# Patient Record
Sex: Male | Born: 1935 | Race: White | Hispanic: No | Smoking: Former smoker
Health system: Southern US, Community
[De-identification: ages and names within clinical notes are randomized; demographics above are authoritative.]

## PROBLEM LIST (undated history)

## (undated) DIAGNOSIS — J449 Chronic obstructive pulmonary disease, unspecified: Secondary | ICD-10-CM

## (undated) DIAGNOSIS — E785 Hyperlipidemia, unspecified: Secondary | ICD-10-CM

## (undated) DIAGNOSIS — I4891 Unspecified atrial fibrillation: Secondary | ICD-10-CM

## (undated) DIAGNOSIS — I639 Cerebral infarction, unspecified: Secondary | ICD-10-CM

## (undated) DIAGNOSIS — I10 Essential (primary) hypertension: Secondary | ICD-10-CM

## (undated) DIAGNOSIS — S72001A Fracture of unspecified part of neck of right femur, initial encounter for closed fracture: Secondary | ICD-10-CM

## (undated) DIAGNOSIS — J189 Pneumonia, unspecified organism: Secondary | ICD-10-CM

## (undated) HISTORY — DX: Hyperlipidemia, unspecified: E78.5

## (undated) HISTORY — DX: Cerebral infarction, unspecified: I63.9

## (undated) HISTORY — DX: Essential (primary) hypertension: I10

## (undated) HISTORY — DX: Unspecified atrial fibrillation: I48.91

## (undated) HISTORY — DX: Pneumonia, unspecified organism: J18.9

## (undated) HISTORY — PX: LACERATION REPAIR: SHX5168

## (undated) HISTORY — PX: CAROTID ENDARTERECTOMY: SUR193

## (undated) HISTORY — DX: Fracture of unspecified part of neck of right femur, initial encounter for closed fracture: S72.001A

## (undated) HISTORY — DX: Chronic obstructive pulmonary disease, unspecified: J44.9

## (undated) HISTORY — PX: OTHER SURGICAL HISTORY: SHX169

---

## 2005-08-04 ENCOUNTER — Ambulatory Visit: Payer: Self-pay | Admitting: Internal Medicine

## 2005-11-02 ENCOUNTER — Ambulatory Visit: Payer: Self-pay | Admitting: Internal Medicine

## 2008-02-03 ENCOUNTER — Ambulatory Visit: Payer: Self-pay | Admitting: Gastroenterology

## 2008-02-03 LAB — CONVERTED CEMR LAB
OCCULT 1: NEGATIVE
OCCULT 3: NEGATIVE
OCCULT 4: NEGATIVE
OCCULT 5: NEGATIVE

## 2008-05-28 ENCOUNTER — Telehealth: Payer: Self-pay | Admitting: Gastroenterology

## 2009-07-19 ENCOUNTER — Ambulatory Visit: Payer: Self-pay | Admitting: Internal Medicine

## 2009-07-19 ENCOUNTER — Telehealth: Payer: Self-pay | Admitting: Internal Medicine

## 2009-07-19 DIAGNOSIS — R609 Edema, unspecified: Secondary | ICD-10-CM

## 2009-07-19 DIAGNOSIS — R0989 Other specified symptoms and signs involving the circulatory and respiratory systems: Secondary | ICD-10-CM

## 2009-07-19 DIAGNOSIS — I4891 Unspecified atrial fibrillation: Secondary | ICD-10-CM | POA: Insufficient documentation

## 2009-07-19 DIAGNOSIS — J449 Chronic obstructive pulmonary disease, unspecified: Secondary | ICD-10-CM

## 2009-07-19 DIAGNOSIS — R0609 Other forms of dyspnea: Secondary | ICD-10-CM

## 2009-07-20 LAB — CONVERTED CEMR LAB
ALT: 13 units/L (ref 0–53)
AST: 18 units/L (ref 0–37)
Alkaline Phosphatase: 90 units/L (ref 39–117)
Basophils Absolute: 0 10*3/uL (ref 0.0–0.1)
Basophils Relative: 0 % (ref 0–1)
CO2: 22 meq/L (ref 19–32)
Creatinine, Ser: 0.77 mg/dL (ref 0.40–1.50)
Eosinophils Absolute: 0.1 10*3/uL (ref 0.0–0.7)
MCHC: 35.1 g/dL (ref 30.0–36.0)
MCV: 95 fL (ref 78.0–100.0)
Neutro Abs: 4.5 10*3/uL (ref 1.7–7.7)
Neutrophils Relative %: 64 % (ref 43–77)
Platelets: 196 10*3/uL (ref 150–400)
RBC: 4.59 M/uL (ref 4.22–5.81)
RDW: 12.5 % (ref 11.5–15.5)
Sodium: 137 meq/L (ref 135–145)
TSH: 1.537 microintl units/mL (ref 0.350–4.500)
Total Bilirubin: 0.8 mg/dL (ref 0.3–1.2)
Total Protein: 7.5 g/dL (ref 6.0–8.3)

## 2009-07-22 ENCOUNTER — Ambulatory Visit: Payer: Self-pay | Admitting: Internal Medicine

## 2009-07-22 LAB — CONVERTED CEMR LAB

## 2009-07-23 ENCOUNTER — Ambulatory Visit: Payer: Self-pay

## 2009-07-23 ENCOUNTER — Telehealth: Payer: Self-pay | Admitting: Internal Medicine

## 2009-07-23 ENCOUNTER — Encounter: Payer: Self-pay | Admitting: Internal Medicine

## 2009-07-25 ENCOUNTER — Ambulatory Visit: Payer: Self-pay | Admitting: Internal Medicine

## 2009-07-25 LAB — CONVERTED CEMR LAB
INR: 1.4
Prothrombin Time: 14.5 s

## 2009-08-02 ENCOUNTER — Ambulatory Visit: Payer: Self-pay | Admitting: Internal Medicine

## 2009-08-02 LAB — CONVERTED CEMR LAB: INR: 2.3

## 2009-08-07 ENCOUNTER — Telehealth: Payer: Self-pay | Admitting: Internal Medicine

## 2009-08-12 ENCOUNTER — Encounter: Payer: Self-pay | Admitting: Internal Medicine

## 2009-08-16 ENCOUNTER — Ambulatory Visit: Payer: Self-pay | Admitting: Internal Medicine

## 2009-08-30 ENCOUNTER — Ambulatory Visit: Payer: Self-pay | Admitting: Internal Medicine

## 2009-09-03 ENCOUNTER — Telehealth: Payer: Self-pay | Admitting: Internal Medicine

## 2009-09-13 ENCOUNTER — Ambulatory Visit: Payer: Self-pay | Admitting: Internal Medicine

## 2009-09-13 LAB — CONVERTED CEMR LAB: INR: 2.3

## 2009-09-30 ENCOUNTER — Ambulatory Visit: Payer: Self-pay | Admitting: Internal Medicine

## 2009-09-30 LAB — CONVERTED CEMR LAB: INR: 1.4

## 2009-10-02 LAB — CONVERTED CEMR LAB
Albumin: 3.5 g/dL (ref 3.5–5.2)
CO2: 33 meq/L — ABNORMAL HIGH (ref 19–32)
Chloride: 104 meq/L (ref 96–112)
GFR calc non Af Amer: 97.23 mL/min (ref >60)
Phosphorus: 3.3 mg/dL (ref 2.3–4.6)
Potassium: 4.2 meq/L (ref 3.5–5.1)

## 2009-10-08 ENCOUNTER — Ambulatory Visit: Payer: Self-pay | Admitting: Internal Medicine

## 2009-10-22 ENCOUNTER — Ambulatory Visit: Payer: Self-pay | Admitting: Internal Medicine

## 2009-10-22 LAB — CONVERTED CEMR LAB: INR: 1.8

## 2009-11-19 ENCOUNTER — Ambulatory Visit: Payer: Self-pay | Admitting: Internal Medicine

## 2009-12-27 ENCOUNTER — Ambulatory Visit: Payer: Self-pay | Admitting: Internal Medicine

## 2010-01-24 ENCOUNTER — Ambulatory Visit: Payer: Self-pay | Admitting: Internal Medicine

## 2010-01-24 LAB — CONVERTED CEMR LAB: Prothrombin Time: 24.1 s

## 2010-02-21 ENCOUNTER — Ambulatory Visit: Payer: Self-pay | Admitting: Internal Medicine

## 2010-02-21 LAB — CONVERTED CEMR LAB
INR: 2.2
Prothrombin Time: 25.9 s

## 2010-03-21 ENCOUNTER — Ambulatory Visit: Payer: Self-pay | Admitting: Internal Medicine

## 2010-03-21 LAB — CONVERTED CEMR LAB
INR: 2.5
Prothrombin Time: 29.5 s

## 2010-04-18 ENCOUNTER — Ambulatory Visit: Payer: Self-pay | Admitting: Internal Medicine

## 2010-05-16 ENCOUNTER — Ambulatory Visit: Payer: Self-pay | Admitting: Internal Medicine

## 2010-06-17 ENCOUNTER — Ambulatory Visit: Payer: Self-pay | Admitting: Internal Medicine

## 2010-06-17 ENCOUNTER — Encounter: Payer: Self-pay | Admitting: Internal Medicine

## 2010-06-17 LAB — CONVERTED CEMR LAB: Prothrombin Time: 29.9 s

## 2010-06-19 LAB — CONVERTED CEMR LAB
Albumin: 3.4 g/dL — ABNORMAL LOW (ref 3.5–5.2)
BUN: 10 mg/dL (ref 6–23)
Basophils Absolute: 0 10*3/uL (ref 0.0–0.1)
Basophils Relative: 0.3 % (ref 0.0–3.0)
Calcium: 8.6 mg/dL (ref 8.4–10.5)
Eosinophils Absolute: 0.1 10*3/uL (ref 0.0–0.7)
Glucose, Bld: 79 mg/dL (ref 70–99)
Hemoglobin: 13.9 g/dL (ref 13.0–17.0)
MCHC: 35.1 g/dL (ref 30.0–36.0)
MCV: 96.7 fL (ref 78.0–100.0)
Monocytes Absolute: 0.5 10*3/uL (ref 0.1–1.0)
Neutro Abs: 3.8 10*3/uL (ref 1.4–7.7)
Phosphorus: 2.8 mg/dL (ref 2.3–4.6)
Potassium: 4.3 meq/L (ref 3.5–5.1)
RBC: 4.1 M/uL — ABNORMAL LOW (ref 4.22–5.81)
RDW: 13.4 % (ref 11.5–14.6)
TSH: 1.24 microintl units/mL (ref 0.35–5.50)
Total Protein: 6.7 g/dL (ref 6.0–8.3)

## 2010-07-15 ENCOUNTER — Ambulatory Visit
Admission: RE | Admit: 2010-07-15 | Discharge: 2010-07-15 | Payer: Self-pay | Source: Home / Self Care | Attending: Internal Medicine | Admitting: Internal Medicine

## 2010-07-15 LAB — CONVERTED CEMR LAB
INR: 2.7
Prothrombin Time: 32.2 s

## 2010-08-05 NOTE — Letter (Signed)
Summary: Out of Work  [facility] [employer] [facility] at [facility]  [address] [place], [place] [address]   Phone: [phone]  Fax: [phone]    July 19, 2009   Employee:  [name] [name]    To Whom It May Concern: [name] was seen today with her father for his physican appt.   For Medical reasons, please excuse the above named employee from work for the following dates:  Start:   07/19/2009  End:   07/19/2009  If you need additional information, please feel free to contact our office.         Sincerely,      Tillman Abide, MD

## 2010-08-05 NOTE — Progress Notes (Signed)
Summary: Feet swollen, cough  Phone Note Call from Patient Call back at [phone]   Caller: Kristy/Granddaughter Call For: Dr. Alphonsus Sias Summary of Call: Patient's granddaughter called wanting to know if her grandfather could be seen.  Says he is coughing and his feet are swollen and blue and have been for about one month.  He has not been seen here since 2007.  She says that he agreed to come in to be seen but I advised her that it is not likely for him to get in today because he would be considered a new patient.  Please advise.  Paper chart in your IN box Initial call taken by: Linde Gillis CMA [facility] Dull),  July 19, 2009 10:52 AM  Follow-up for Phone Call        okay to add at the end of the day Please preload chart Follow-up by: Cindee Salt MD,  July 19, 2009 11:13 AM  Additional Follow-up for Phone Call Additional follow up Details #1::        spoke with daughter and she wanted to know if pt can be seen tomorrow? I advised pt that we are closed on Sat and that pt can not go to Sat clinic. Daughter states her daughter was bringing pt in but she has to be at work at 4:00pm and she'll see if someone can bring pt in, I will put him on the schedule just in case he comes. DeShannon Smith CMA [facility] Dull)  July 19, 2009 11:23   noted but I will need to go to Nursing Home if he is not coming Additional Follow-up by: Cindee Salt MD,  July 19, 2009 11:40 AM

## 2010-08-05 NOTE — Assessment & Plan Note (Signed)
Summary: 2:30 2 m f/u dlo   Vital Signs:  Patient profile:   77 year old male Weight:      168 pounds O2 Sat:      97 % on Room air Temp:     98.3 degrees F oral Pulse rate:   98 / minute Pulse rhythm:   regular BP sitting:   140 / 90  (left arm) Cuff size:   large  Vitals Entered By: Mervin Hack CMA [facility] Dull) (September 30, 2009 2:35 PM)  O2 Flow:  Room air CC: 2 month follow-up   History of Present Illness: In with grandduaghter  Stopped the coumadin He was thinking he may not need to continue it stopped 3 days ago  No palpitations lately No chest pain No SOB  Did get the Qvar Not sure that this has helped any Some cough--not as bad (just about gone) Rinses mouth after inhaler No sores in mouth  No stomach trouble no black stools  edema gone on the furosemide  Allergies: No Known Drug Allergies  Past History:  Past medical, surgical, family and social histories (including risk factors) reviewed for relevance to current acute and chronic problems.  Past Medical History: Reviewed history from 07/19/2009 and no changes required. Cataract OD Gastric Ulcers Cataract Removal OS Atrial fibrillation COPD  Past Surgical History: Reviewed history from 07/19/2009 and no changes required. 1977- Gastric Ulcers with hemorrhage and surgical repair 1983- laceration right wrist  2004- Cataract removal 2007 Right eye cataract  Family History: Reviewed history from 07/19/2009 and no changes required. Dad died of kidney trouble Mom died of stroke CAD, HTN run in family No cancer  Social History: Reviewed history from 07/19/2009 and no changes required. Retired--textile worker Widower 2 daughters, 1 son Current Smoker--intermittent Lives with oldest daughter  Has DNR--reviewed and he still wants this  Review of Systems       appetite is okay weight down 4#  Physical Exam  General:  alert and normal appearance.   Neck:  supple, no masses, and no  cervical lymphadenopathy.   Lungs:  normal respiratory effort, no intercostal retractions, and no accessory muscle use.  Mild expiratory wheezes but not really tight Heart:  normal rate, no murmur, no gallop, and irregular rhythm.   Extremities:  no edema Skin:  no rashes and no suspicious lesions.   Psych:  normally interactive, good eye contact, not anxious appearing, and not depressed appearing.     Impression & Recommendations:  Problem # 1:  ATRIAL FIBRILLATION (ICD-427.31) Assessment Unchanged rate is well controlled told him he needs to stay on the warfairn indefiinitely discussed with granddaughter who understands the rationale  His updated medication list for this problem includes:    Warfarin Sodium 5 Mg Tabs (Warfarin sodium) ..... Use as directed  Problem # 2:  COPD (ICD-496) Assessment: Improved less cough since on the qvar not on spiriva since it didn't seem to help  The following medications were removed from the medication list:    Spiriva Handihaler 18 Mcg Caps (Tiotropium bromide monohydrate) .[name]... 1 capsule inhaled daily for emphysema His updated medication list for this problem includes:    Qvar 40 Mcg/act Aers (Beclomethasone dipropionate) .[name]... 2 inhalations daily with spacer. rinse mouth after  Problem # 3:  EDEMA- LOCALIZED (ICD-782.3) Assessment: Improved  better on the furosemide  His updated medication list for this problem includes:    Furosemide 20 Mg Tabs (Furosemide) .[name]... 1 daily to reduce swelling  Orders: TLB-Renal Function Panel (  80069-RENAL) Venipuncture (60454)  Complete Medication List: 1)  Warfarin Sodium 5 Mg Tabs (Warfarin sodium) .... Use as directed 2)  Furosemide 20 Mg Tabs (Furosemide) .[name].. 1 daily to reduce swelling 3)  Qvar 40 Mcg/act Aers (Beclomethasone dipropionate) .... 2 inhalations daily with spacer. rinse mouth after  Patient Instructions: 1)  Please restart the warfarin at the usual dose 2)  Continue the Q-var but  not the spiriva 3)  Please schedule a follow-up appointment in 3 months .   Current Allergies (reviewed today): No known allergies

## 2010-08-05 NOTE — Progress Notes (Signed)
Summary: warfarin  Phone Note Call from Patient   Caller: Patient Call For: Cindee Salt MD Summary of Call: Granddaughter called asking about patient's dosage of warfarin. She said that it was changed last friday, and that a new rx was supposed to be called in and it wasn't. [facility] [facility]  Initial call taken by: Melody Comas,  September 03, 2009 2:13 PM  Follow-up for Phone Call        left message on machine for patient's grandaughter Silva Bandy to return call.  DeShannon Katrinka Blazing CMA [facility] Dull)  September 03, 2009 2:38 PM   Additional Follow-up for Phone Call Additional follow up Details #1::        Rx Called In, Spoke with patient's grandaughter and advised results.  Additional Follow-up by: Mervin Hack CMA [facility] Dull),  September 03, 2009 2:43 PM    New/Updated Medications: WARFARIN SODIUM 5 MG TABS (WARFARIN SODIUM) use as directed Prescriptions: WARFARIN SODIUM 5 MG TABS (WARFARIN SODIUM) use as directed  #60 x 6   Entered by:   Mervin Hack CMA (AAMA)   Authorized by:   Cindee Salt MD   Signed by:   Mervin Hack CMA (AAMA) on 09/03/2009   Method used:   [facility]        [id number] ([facility]       [address]       [place], [place]  [address]       Ph: [phone] or [phone]       Fax: [phone]   RxID:   [id number]

## 2010-08-05 NOTE — Miscellaneous (Signed)
Summary: warfarin  Clinical Lists Changes  Medications: Changed medication from WARFARIN SODIUM 5 MG TABS (WARFARIN SODIUM) 1 tab daily or as directed to WARFARIN SODIUM 5 MG TABS (WARFARIN SODIUM) take one and one half 3 days a week and one the other 4 days - Signed Rx of WARFARIN SODIUM 5 MG TABS (WARFARIN SODIUM) take one and one half 3 days a week and one the other 4 days;  #1 month x 3;  Signed;  Entered by: Lowella Petties CMA;  Authorized by: Cindee Salt MD;  Method used: [id number] [address], [place], [place]  [address], Ph: [phone] or [phone], Fax: [phone]    Prescriptions: WARFARIN SODIUM 5 MG TABS (WARFARIN SODIUM) take one and one half 3 days a week and one the other 4 days  #1 month x 3   Entered by:   Lowella Petties CMA   Authorized by:   Cindee Salt MD   Signed by:   Lowella Petties CMA on 08/12/2009   Method used:   Telephoned to ...       [id number] (retail)       [address]       [place], [place]  [address]       Ph: [phone] or [phone]       Fax: [phone]   RxID:   [id number]   Prior Medications: FUROSEMIDE 20 MG TABS (FUROSEMIDE) 1 daily to reduce swelling SPIRIVA HANDIHALER 18 MCG CAPS (TIOTROPIUM BROMIDE MONOHYDRATE) 1 capsule inhaled daily for emphysema PREDNISONE 20 MG TABS (PREDNISONE) 2 tabs daily for 5 days then 1 tab daily for 5 days QVAR 40 MCG/ACT AERS (BECLOMETHASONE DIPROPIONATE) 2 inhalations daily with spacer. Rinse mouth after Current Allergies: No known allergies

## 2010-08-05 NOTE — Progress Notes (Signed)
Summary: Called chest xray report  Phone Note From Other Clinic   Caller: Amy at [facility] Call For: Dr. Alphonsus Sias Summary of Call: Amy with [facility] called report for chest xray done 07/19/09. 1) Advanced COPD. 2) Possible medial right lower lobe bronchopneumonia. Report will be faxed over later today. Initial call taken by: Lewanda Rife LPN,  July 23, 2009 8:27 AM  Follow-up for Phone Call        discussed with patient Not clearly having infection but will treat with antibiotic just in case augmentin since less likely to affect the coumadin Follow-up by: Cindee Salt MD,  July 23, 2009 8:59 AM    New/Updated Medications: AMOXICILLIN-POT CLAVULANATE 875-125 MG TABS (AMOXICILLIN-POT CLAVULANATE) 1 tab by mouth two times a day with food for possible pneumonia Prescriptions: AMOXICILLIN-POT CLAVULANATE 875-125 MG TABS (AMOXICILLIN-POT CLAVULANATE) 1 tab by mouth two times a day with food for possible pneumonia  #20 x 0   Entered and Authorized by:   Cindee Salt MD   Signed by:   Cindee Salt MD on 07/23/2009   Method used:   [facility] to        [id number] ([facility]       [address]       [place], [place]  [address]       Ph: [phone] or [phone]       Fax: [phone]   RxID:   [id number]

## 2010-08-05 NOTE — Assessment & Plan Note (Signed)
Summary: ROA 3 MTHS CYD   Vital Signs:  Patient profile:   77 year old male Weight:      169 pounds BMI:     23.00 Temp:     98.1 degrees F oral Pulse rate:   82 / minute Pulse rhythm:   regular BP sitting:   110 / 60  (left arm) Cuff size:   large  Vitals Entered By: Mervin Hack CMA Duncan Dull) (Kelijah 24, 2011 12:19 PM) CC: 3 month follow-up   History of Present Illness: Feels pretty well  Breathing still okay still on qvar doesn't feel much as he inhales it discussed using spacer does rinse mouth out  On the coumadin No heart problems No palpitations of note No chest pain  Edema is basically gone   Allergies: No Known Drug Allergies  Past History:  Past medical, surgical, family and social histories (including risk factors) reviewed for relevance to current acute and chronic problems.  Past Medical History: Reviewed history from 07/19/2009 and no changes required. Cataract OD Gastric Ulcers Cataract Removal OS Atrial fibrillation COPD  Past Surgical History: Reviewed history from 07/19/2009 and no changes required. 1977- Gastric Ulcers with hemorrhage and surgical repair 1983- laceration right wrist  2004- Cataract removal 2007 Right eye cataract  Family History: Reviewed history from 07/19/2009 and no changes required. Dad died of kidney trouble Mom died of stroke CAD, HTN run in family No cancer  Social History: Reviewed history from 07/19/2009 and no changes required. Retired--textile worker Widower 2 daughters, 1 son Current Smoker--intermittent Lives with oldest daughter  Has DNR--reviewed and he still wants this  Review of Systems       appetite is fine weight is stable always has trouble initiating sleep---seems to sleep better in AM Not really tired during the day stays active  generally happy  Physical Exam  General:  alert and normal appearance.   Neck:  supple, no masses, no thyromegaly, no carotid bruits, and no  cervical lymphadenopathy.   Lungs:  normal respiratory effort and no crackles.   Slightly decreased breath sounds with mild exp rhonchi. Not really tight though Heart:  normal rate, no murmur, no gallop, and irregular rhythm.   Extremities:  no edema Neurologic:  alert & oriented X3, strength normal in all extremities, and gait normal.   Psych:  normally interactive, good eye contact, not anxious appearing, and not depressed appearing.     Impression & Recommendations:  Problem # 1:  ATRIAL FIBRILLATION (ICD-427.31) Assessment Unchanged good rate control without meds on coumadin  His updated medication list for this problem includes:    Warfarin Sodium 5 Mg Tabs (Warfarin sodium) ..... Use as directed    Warfarin Sodium 7.5 Mg Tabs (Warfarin sodium) .[name]... Take as directed  Problem # 2:  COPD (ICD-496) Assessment: Unchanged stable no sig limitations mild improvement with the qvar  His updated medication list for this problem includes:    Qvar 40 Mcg/act Aers (Beclomethasone dipropionate) .[name]... 2 inhalations daily with spacer. rinse mouth after  Problem # 3:  EDEMA- LOCALIZED (ICD-782.3) Assessment: Improved will continue low dose diuretic since he had dyspnea when holding onto fluid  His updated medication list for this problem includes:    Furosemide 20 Mg Tabs (Furosemide) .[name]... 1 daily to reduce swelling  Complete Medication List: 1)  Warfarin Sodium 5 Mg Tabs (Warfarin sodium) .... Use as directed 2)  Furosemide 20 Mg Tabs (Furosemide) .[name].. 1 daily to reduce swelling 3)  Qvar 40 Mcg/act Aers (  Beclomethasone dipropionate) .... 2 inhalations daily with spacer. rinse mouth after 4)  Warfarin Sodium 7.5 Mg Tabs (Warfarin sodium) .... Take as directed  Patient Instructions: 1)  Please schedule a follow-up appointment in 6 months .   Current Allergies (reviewed today): No known allergies

## 2010-08-05 NOTE — Assessment & Plan Note (Signed)
Summary: FOLLOW-UP/DS   Vital Signs:  Patient profile:   77 year old male Weight:      174.50 pounds Temp:     97.5 degrees F oral Pulse rate:   68 / minute Pulse rhythm:   regular Resp:     18 per minute BP sitting:   142 / 88  (left arm) Cuff size:   regular  Vitals Entered By: Linde Gillis CMA Duncan Dull) (July 22, 2009 4:28 PM) CC: follow up   History of Present Illness: No problems taking the meds He doesn't notice any difference Still coughing but he still doesn't feel sick  feet look about the same Still has easy dyspnea on exertion  No chest pain  No weakness No aphasia or swallowing difficulty  Allergies (verified): No Known Drug Allergies  Review of Systems       weight actually up he uses a lot of salt did quit smoking after last visit appetite is fine  Physical Exam  General:  alert.  NAD Neck:  supple, no masses, no thyromegaly, and no cervical lymphadenopathy.   Lungs:  normal respiratory effort, no intercostal retractions, and no accessory muscle use.   Decreased breath sounds with scattered wheezes no crackles Heart:  normal rate, no murmur, no gallop, and irregular rhythm.  Fairly regular rhythm but occ irregular sounding Extremities:  1+ edema in feet only dependent redness with less stasis changes Psych:  normally interactive, good eye contact, not anxious appearing, and not depressed appearing.     Impression & Recommendations:  Problem # 1:  COPD (ICD-496) Assessment Unchanged  probably the cause of his dyspnea and cough will try spiriva--sample given and 1st dose done  His updated medication list for this problem includes:    Spiriva Handihaler 18 Mcg Caps (Tiotropium bromide monohydrate) .[name]... 1 capsule inhaled daily for emphysema  Problem # 2:  ATRIAL FIBRILLATION (ICD-427.31) Assessment: Comment Only rate is stable echo tomorrow on coumadin  His updated medication list for this problem includes:    Warfarin Sodium 5 Mg  Tabs (Warfarin sodium) .[name]... 1 tab daily or as directed  Orders: Protime (16109UE)  Problem # 3:  EDEMA- LOCALIZED (ICD-782.3) Assessment: Comment Only about the same will have him stop the salt His updated medication list for this problem includes:    Furosemide 20 Mg Tabs (Furosemide) .[name]... 1 daily to reduce swelling  Complete Medication List: 1)  Warfarin Sodium 5 Mg Tabs (Warfarin sodium) .[name].. 1 tab daily or as directed 2)  Furosemide 20 Mg Tabs (Furosemide) .[name].. 1 daily to reduce swelling 3)  Spiriva Handihaler 18 Mcg Caps (Tiotropium bromide monohydrate) .[name].. 1 capsule inhaled daily for emphysema  Patient Instructions: 1)  Please change salt to salt substitute 2)  Please schedule a follow-up appointment in 2 weeks.  Prescriptions: SPIRIVA HANDIHALER 18 MCG CAPS (TIOTROPIUM BROMIDE MONOHYDRATE) 1 capsule inhaled daily for emphysema  #30 x 0   Entered and Authorized by:   Cindee Salt MD   Signed by:   Cindee Salt MD on 07/22/2009   Method used:   [facility]        [id number]       [address]       [place], [place]  [address]       Ph: [phone] or [phone]       Fax: [phone]   RxID:   [id number]  Laboratory Results   Blood Tests   Date/Time Recieved: July 22, 2009 4:28 PM  Date/Time Reported: July 22, 2009 4:28 PM   PT: 13.5 s   (Normal Range: 10.6-13.4)  INR: 1.2   (Normal Range: 0.88-1.12   Therap INR: 2.0-3.5)    Current Allergies (reviewed today): No known allergies    ANTICOAGULATION RECORD  NEW REGIMEN & LAB RESULTS Anticoag. Dx: Atrial fibrillation Current INR Goal Range: 2.0-3.0 Current INR: 1.2 Regimen: 5mg  qd  Provider: Adra Shepler      Repeat testing in: 3 days MEDICATIONS WARFARIN SODIUM 5 MG TABS (WARFARIN SODIUM) 1 tab daily or as directed FUROSEMIDE 20 MG TABS (FUROSEMIDE) 1 daily to reduce swelling SPIRIVA HANDIHALER 18 MCG CAPS (TIOTROPIUM BROMIDE  MONOHYDRATE) 1 capsule inhaled daily for emphysema   Anticoagulation Visit Questionnaire      Coumadin dose missed/changed:  No      Abnormal Bleeding Symptoms:  No Any diet changes including alcohol intake, vegetables or greens since the last visit:  No Any illnesses or hospitalizations since the last visit:  No Any signs of clotting since the last visit (including chest discomfort, dizziness, shortness of breath, arm tingling, slurred speech, swelling or redness in leg):  No

## 2010-08-05 NOTE — Assessment & Plan Note (Signed)
Summary: BLUE FEET/SOB/ds   Vital Signs:  Patient profile:   77 year old male Height:      72 inches Weight:      172 pounds O2 Sat:      95 % on Room air Temp:     98.3 degrees F oral Pulse rate:   98 / minute Pulse rhythm:   regular BP sitting:   148 / 90  (left arm) Cuff size:   large  Vitals Entered By: Mervin Hack CMA (AAMA) (July 19, 2009 4:05 PM)  O2 Flow:  Room air CC: cold/ blue feet   History of Present Illness: Having a bad cough Noted swelling in feet also started 3-4 weeks ago  Doesn't feel sick No fever Does get SOB with any exertion--fairly easy now  No chest pain Cough gives just slight clear phlegm  No palpitations  Preventive Screening-Counseling & Management  Alcohol-Tobacco     Smoking Status: current  Allergies (verified): No Known Drug Allergies  Past History:  Past Medical History: Cataract OD Gastric Ulcers Cataract Removal OS Atrial fibrillation COPD  Past Surgical History: 1977- Gastric Ulcers with hemorrhage and surgical repair 1983- laceration right wrist  2004- Cataract removal 2007 Right eye cataract  Family History: Dad died of kidney trouble Mom died of stroke CAD, HTN run in family No cancer  Social History: Programmer, systems Widower 2 daughters, 1 son Current Smoker--intermittent Lives with oldest daughter  Has DNR--reviewed and he still wants thisSmoking Status:  current  Review of Systems General:  weight stable Cough bothering sleep now. Eyes:  Denies double vision and vision loss-1 eye. ENT:  Complains of decreased hearing; denies ringing in ears; dentures on top doesn't wear bottoms. CV:  See HPI. Resp:  See HPI. GI:  Denies abdominal pain, bloody stools, change in bowel habits, indigestion, nausea, and vomiting. GU:  Complains of nocturia; denies urinary frequency and urinary hesitancy; only occ nocturia. MS:  Denies joint pain and joint swelling. Derm:  Denies lesion(s) and  rash. Neuro:  Denies headaches, numbness, tingling, and weakness. Psych:  Denies anxiety and depression. Heme:  Denies abnormal bruising and enlarge lymph nodes.  Physical Exam  General:  alert.  NAD Eyes:  pupils equal and pupils round.   Miotic pupils Mouth:  no erythema and no lesions.   Neck:  supple, no masses, no thyromegaly, no carotid bruits, and no cervical lymphadenopathy.   Lungs:  normal respiratory effort, no intercostal retractions, no accessory muscle use, and no dullness.   Decreased breath sounds and generalized rhonchi Heart:  Regular with freq ectopy vs. irregular rhythm normal rate, no murmur, and no gallop.   Abdomen:  soft, non-tender, no hepatomegaly, and no splenomegaly.   Pulses:  1+ in feet Extremities:  1+ edema in ankles Neurologic:  alert & oriented X3, strength normal in all extremities, and gait normal.   Skin:  no suspicious lesions and no ulcerations.   Mild venous stasis changes in calves Axillary Nodes:  No palpable lymphadenopathy Psych:  normally interactive, good eye contact, not anxious appearing, and not depressed appearing.     Impression & Recommendations:  Problem # 1:  ATRIAL FIBRILLATION (ICD-427.31) Assessment New  probably the cause of edema and DOE not clear if this is causing cough unless it is due to some vascular congestion  discussed options he and daughter agree to coumadin will set up echo  The following medications were removed from the medication list:    Aspirin 81 Mg Tabs (Aspirin) .Marland KitchenMarland KitchenMarland KitchenMarland Kitchen  Take 1 by mouth once daily His updated medication list for this problem includes:    Warfarin Sodium 5 Mg Tabs (Warfarin sodium) .[name]... 1 tab daily or as directed  Orders: EKG w/ Interpretation (93000) Echo Referral (Echo)  Problem # 2:  COUGH (ICD-786.2) Assessment: New likely related to his undiagnosed COPD could be pulm vascular congestion no infection will not treat other than the lasix  Orders: CXR- 2view  (CXR) Spirometry w/Graph (94010)  Problem # 3:  COPD (ICD-496) Assessment: New urged him to stop smoking  Complete Medication List: 1)  Warfarin Sodium 5 Mg Tabs (Warfarin sodium) .[name].. 1 tab daily or as directed 2)  Furosemide 20 Mg Tabs (Furosemide) .[name].. 1 daily to reduce swelling  Other Orders: T-Comprehensive Metabolic Panel [id number]) T-CBC w/Diff (09811-91478) T-TSH (29562-13086) Venipuncture (57846) Specimen Handling ([address]295)  Patient Instructions: 1)  Please come in for follow up at 4:30PM on Monday 2)  We will schedule the echocardiogram on Monday Prescriptions: FUROSEMIDE 20 MG TABS (FUROSEMIDE) 1 daily to reduce swelling  #30 x 3   Entered and Authorized by:   Cindee Salt MD   Signed by:   Cindee Salt MD on 07/19/2009   Method used:   Electronically to        [id number] (retail)       [address]       [place], [place]  [address]       Ph: [phone] or [phone]       Fax: [phone]   RxID:   [id number] WARFARIN SODIUM 5 MG TABS (WARFARIN SODIUM) 1 tab daily or as directed  #30 x 1   Entered and Authorized by:   Cindee Salt MD   Signed by:   Cindee Salt MD on 07/19/2009   Method used:   Electronically to        [id number] (retail)       [address]       [place], [place]  [address]       Ph: [phone] or [phone]       Fax: [phone]   RxID:   [id number]   Prior Medications (reviewed today): None Current Allergies (reviewed today): No known allergies      Tetanus/Td Immunization History:    Tetanus/Td # 1:  Historical (08/04/2005)   Appended Document: BLUE FEET/SOB/ds    Clinical Lists Changes  Observations: Added new observation of EKG INTERP: atrial fib @97  No obvious ischemia (07/19/2009 17:54)       EKG  Procedure date:  07/19/2009  Findings:      atrial fib @97  No obvious ischemia  Also, spirometry  showed severe obstruction

## 2010-08-05 NOTE — Assessment & Plan Note (Signed)
Summary: 2 wk f/u dlo   Vital Signs:  Patient profile:   77 year old male Weight:      172 pounds Temp:     98.0 degrees F oral Pulse rate:   88 / minute Pulse rhythm:   regular Resp:     20 per minute BP sitting:   128 / 60 Cuff size:   large  Vitals Entered By: Mervin Hack CMA Duncan Dull) (August 02, 2009 2:50 PM) CC: 2 week follow-up   History of Present Illness: did take the antibiotic still coughing  Taking the spiriva Did fill the prescription Not clear if his exercise tolerance is any better  No chest pain No palpitations Edema seems better now  No stomach troubles  Allergies: No Known Drug Allergies  Past History:  Past medical, surgical, family and social histories (including risk factors) reviewed for relevance to current acute and chronic problems.  Past Medical History: Reviewed history from 07/19/2009 and no changes required. Cataract OD Gastric Ulcers Cataract Removal OS Atrial fibrillation COPD  Past Surgical History: Reviewed history from 07/19/2009 and no changes required. 1977- Gastric Ulcers with hemorrhage and surgical repair 1983- laceration right wrist  2004- Cataract removal 2007 Right eye cataract  Family History: Reviewed history from 07/19/2009 and no changes required. Dad died of kidney trouble Mom died of stroke CAD, HTN run in family No cancer  Social History: Reviewed history from 07/19/2009 and no changes required. Retired--textile worker Widower 2 daughters, 1 son Current Smoker--intermittent Lives with oldest daughter  Has DNR--reviewed and he still wants this  Review of Systems       appetite is fine weight down 2# sleeps okay--but occ can't initiate---no PND. Sleeps flat  Physical Exam  General:  alert and normal appearance.   Neck:  supple, no masses, and no thyromegaly.   Lungs:  normal respiratory effort, no intercostal retractions, and no accessory muscle use.  Decreased breath sounds, mild exp  phase prolongation and wheezes Heart:  normal rate, no murmur, no gallop, and irregular rhythm.   Pulses:  faint in feet Extremities:  trace edema Psych:  normally interactive, good eye contact, not anxious appearing, and not depressed appearing.     Impression & Recommendations:  Problem # 1:  COPD (ICD-496) Assessment Unchanged ongoing symptoms Not clearly better with spiriva still with cough  P: 10 day prednisone trial    if improves, would start inhaled steroid  His updated medication list for this problem includes:    Spiriva Handihaler 18 Mcg Caps (Tiotropium bromide monohydrate) .[name]... 1 capsule inhaled daily for emphysema  Problem # 2:  ATRIAL FIBRILLATION (ICD-427.31) Assessment: Unchanged good rate control therapeutic on coumadin  His updated medication list for this problem includes:    Warfarin Sodium 5 Mg Tabs (Warfarin sodium) .[name]... 1 tab daily or as directed  Problem # 3:  EDEMA- LOCALIZED (ICD-782.3) Assessment: Improved lasix as needed  His updated medication list for this problem includes:    Furosemide 20 Mg Tabs (Furosemide) .[name]... 1 daily to reduce swelling  Complete Medication List: 1)  Warfarin Sodium 5 Mg Tabs (Warfarin sodium) .[name].. 1 tab daily or as directed 2)  Furosemide 20 Mg Tabs (Furosemide) .[name].. 1 daily to reduce swelling 3)  Spiriva Handihaler 18 Mcg Caps (Tiotropium bromide monohydrate) .[name].. 1 capsule inhaled daily for emphysema 4)  Prednisone 20 Mg Tabs (Prednisone) .... 2 tabs daily for 5 days then 1 tab daily for 5 days  Patient Instructions: 1)  Please call if  your breathing gets noticeably better on the prednisone pills 2)  Please schedule your protime for 2 weeks, not 4 weeks 3)  Please schedule a follow-up appointment in 2 months.  Prescriptions: PREDNISONE 20 MG TABS (PREDNISONE) 2 tabs daily for 5 days then 1 tab daily for 5 days  #15 x 1   Entered and Authorized by:   Cindee Salt MD   Signed by:   Cindee Salt MD on  08/02/2009   Method used:   Electronically to        [id number] ([facility]       [address]       [place], [place]  [address]       Ph: [phone] or [phone]       Fax: [phone]   RxID:   [id number]   Current Allergies (reviewed today): No known allergies

## 2010-08-05 NOTE — Progress Notes (Signed)
Summary: wants inhaler  Phone Note Refill Request Message from:  Kristi-granddaughter on August 07, 2009 3:55 PM  grandaughter would like inhaler called in, she states the prednisone is helping.   Method Requested: Electronic Initial call taken by: Melody Comas,  August 07, 2009 3:55 PM  Follow-up for Phone Call        q-var 40 2 inhalations daily with spacer. Rinse mouth after #1 x 1 year Have pharmacist dispense spacer with the first inhaler Follow-up by: Cindee Salt MD,  August 07, 2009 5:46 PM  Additional Follow-up for Phone Call Additional follow up Details #1::        Rx faxed to pharmacy, Spoke with patient's grandaughter and advised results.  Additional Follow-up by: Mervin Hack CMA (AAMA),  August 08, 2009 8:44 AM    New/Updated Medications: QVAR 40 MCG/ACT AERS (BECLOMETHASONE DIPROPIONATE) 2 inhalations daily with spacer. Rinse mouth after Prescriptions: QVAR 40 MCG/ACT AERS (BECLOMETHASONE DIPROPIONATE) 2 inhalations daily with spacer. Rinse mouth after  #1 x 12   Entered by:   Mervin Hack CMA (AAMA)   Authorized by:   Cindee Salt MD   Signed by:   Mervin Hack CMA (AAMA) on 08/08/2009   Method used:   [facility]ronically to        [id number] ([facility]       [address]       [place], [place]  [address]       Ph: [phone] or [phone]       Fax: [phone]   RxID:   [id number]

## 2010-08-07 NOTE — Assessment & Plan Note (Signed)
Summary: ROA  CYD   Vital Signs:  Patient profile:   77 year old male Weight:      169 pounds Temp:     97.8 degrees F oral Pulse rate:   76 / minute Pulse rhythm:   regular BP sitting:   150 / 70  (left arm) Cuff size:   large  Vitals Entered By: Mervin Hack CMA Duncan Dull) (June 17, 2010 8:54 AM) CC: follow-up visit   History of Present Illness: Doing well  has a bit of a cough and wheeze--esp at night keeps him up at night at times does have some daytime cough from the mucus Breathing is okay--still on qvar two times a day (one puff) does rinse mouth after  No known allergy problems year long phenomenon with the "hanging cough"  No chest pain No palpitations edema is not a problem---is on the furosemide daily    Allergies: No Known Drug Allergies  Past History:  Past medical, surgical, family and social histories (including risk factors) reviewed for relevance to current acute and chronic problems.  Past Medical History: Reviewed history from 07/19/2009 and no changes required. Cataract OD Gastric Ulcers Cataract Removal OS Atrial fibrillation COPD  Past Surgical History: Reviewed history from 07/19/2009 and no changes required. 1977- Gastric Ulcers with hemorrhage and surgical repair 1983- laceration right wrist  2004- Cataract removal 2007 Right eye cataract  Family History: Reviewed history from 07/19/2009 and no changes required. Dad died of kidney trouble Mom died of stroke CAD, HTN run in family No cancer  Social History: Reviewed history from 07/19/2009 and no changes required. Retired--textile worker Widower 2 daughters, 1 son Current Smoker--intermittent Lives with oldest daughter  Has DNR--reviewed and he still wants this  Review of Systems       Voids fairly frequently but not a problem appetite is okay weight stable cough keeps him up some  Physical Exam  General:  alert and normal appearance.   Nose:  mild pale  congestion without sig inflammation Mouth:  no erythema and no exudates.   Neck:  supple, no masses, no thyromegaly, no carotid bruits, and no cervical lymphadenopathy.   Lungs:  normal respiratory effort, no intercostal retractions, and no accessory muscle use.   Decreased breath sounds Mild increased exp phase and exp rhonchi and slight wheezing Heart:  normal rate, no murmur, no gallop, and irregular rhythm.   Abdomen:  soft and non-tender.   Extremities:  no edema Psych:  normally interactive, good eye contact, not anxious appearing, and not depressed appearing.     Impression & Recommendations:  Problem # 1:  COPD (ICD-496) Assessment Deteriorated  having daily symptoms will give spacer and demonstrated technique increase the qvar---he was only taking 1 two times a day  rescue albuterol consider adding long acting beta agonist  His updated medication list for this problem includes:    Qvar 40 Mcg/act Aers (Beclomethasone dipropionate) .[name]... 2 inhalations  twice daily with spacer. rinse mouth after    Ventolin Hfa 108 (90 Base) Mcg/act Aers (Albuterol sulfate) .[name]... 2 inhalations three times a day as needed for cough or wheezing. use with spacer  Orders: Spirometry w/Graph (94010)  Problem # 2:  ATRIAL FIBRILLATION (ICD-427.31) Assessment: Unchanged good rate control on coumadin  His updated medication list for this problem includes:    Warfarin Sodium 5 Mg Tabs (Warfarin sodium) ..... Use as directed    Warfarin Sodium 7.5 Mg Tabs (Warfarin sodium) .[name]... Take as directed  Problem # 3:  EDEMA- LOCALIZED (ICD-782.3) Assessment: Unchanged  controlled with lasix due for labs  His updated medication list for this problem includes:    Furosemide 20 Mg Tabs (Furosemide) .[name]... 1 daily to reduce swelling  Orders: Venipuncture ([address]) TLB-Renal Function Panel (80069-RENAL) TLB-CBC Platelet - w/Differential (85025-CBCD) TLB-Hepatic/Liver Function Pnl  (80076-HEPATIC) TLB-TSH (Thyroid Stimulating Hormone) (84443-TSH)  Complete Medication List: 1)  Warfarin Sodium 5 Mg Tabs (Warfarin sodium) .... Use as directed 2)  Furosemide 20 Mg Tabs (Furosemide) .[name].. 1 daily to reduce swelling 3)  Qvar 40 Mcg/act Aers (Beclomethasone dipropionate) .... 2 inhalations  twice daily with spacer. rinse mouth after 4)  Warfarin Sodium 7.5 Mg Tabs (Warfarin sodium) .... Take as directed 5)  Ventolin Hfa 108 (90 Base) Mcg/act Aers (Albuterol sulfate) .... 2 inhalations three times a day as needed for cough or wheezing. use with spacer  Patient Instructions: 1)  Please schedule a follow-up appointment in 3 months .  2)  Please increase the qvar to 2 puffs two times a day and use the spacer 3)  Please use the new inhaler (albuterol) before bedtime and other times if the cough is bothering you Prescriptions: VENTOLIN HFA 108 (90 BASE) MCG/ACT AERS (ALBUTEROL SULFATE) 2 inhalations three times a day as needed for cough or wheezing. Use with spacer  #1 x 3   Entered and Authorized by:   Cindee Salt MD   Signed by:   Cindee Salt MD on 06/17/2010   Method used:   [facility]ron[facility] to        [id number]       [address]       [place], [place]  [address]       Ph: [phone] or [phone]       Fax: [phone]   RxID:   [id number]    Orders Added: 1)  Spirometry w/Graph [94010] 2)  Est. Patient Level IV [37169] 3)  Venipuncture [67893] 4)  TLB-Renal Function Panel [80069-RENAL] 5)  TLB-CBC Platelet - w/Differential [85025-CBCD] 6)  TLB-Hepatic/Liver Function Pnl [80076-HEPATIC] 7)  TLB-TSH (Thyroid Stimulating Hormone) [81017-PZW]      Current Allergies (reviewed today): No known allergies

## 2010-08-08 NOTE — Miscellaneous (Signed)
Summary: Do Not Resuscitate Order  Do Not Resuscitate Order   Imported By: Beau Fanny 07/22/2009 15:44:47  _____________________________________________________________________  External Attachment:    Type:   Image     Comment:   External Document

## 2010-08-12 ENCOUNTER — Encounter: Payer: Self-pay | Admitting: Internal Medicine

## 2010-08-12 ENCOUNTER — Ambulatory Visit (INDEPENDENT_AMBULATORY_CARE_PROVIDER_SITE_OTHER): Payer: MEDICARE

## 2010-08-12 DIAGNOSIS — I4891 Unspecified atrial fibrillation: Secondary | ICD-10-CM

## 2010-08-12 DIAGNOSIS — Z7901 Long term (current) use of anticoagulants: Secondary | ICD-10-CM

## 2010-08-12 DIAGNOSIS — Z5181 Encounter for therapeutic drug level monitoring: Secondary | ICD-10-CM

## 2010-08-12 LAB — CONVERTED CEMR LAB
INR: 2.8
Prothrombin Time: 33.9 s

## 2010-09-15 ENCOUNTER — Encounter: Payer: Self-pay | Admitting: Internal Medicine

## 2010-09-15 ENCOUNTER — Ambulatory Visit (INDEPENDENT_AMBULATORY_CARE_PROVIDER_SITE_OTHER): Payer: MEDICARE | Admitting: Internal Medicine

## 2010-09-15 ENCOUNTER — Ambulatory Visit (INDEPENDENT_AMBULATORY_CARE_PROVIDER_SITE_OTHER): Payer: MEDICARE

## 2010-09-15 DIAGNOSIS — J449 Chronic obstructive pulmonary disease, unspecified: Secondary | ICD-10-CM

## 2010-09-15 DIAGNOSIS — Z5181 Encounter for therapeutic drug level monitoring: Secondary | ICD-10-CM

## 2010-09-15 DIAGNOSIS — I4891 Unspecified atrial fibrillation: Secondary | ICD-10-CM

## 2010-09-15 DIAGNOSIS — J4489 Other specified chronic obstructive pulmonary disease: Secondary | ICD-10-CM

## 2010-09-15 DIAGNOSIS — Z7901 Long term (current) use of anticoagulants: Secondary | ICD-10-CM

## 2010-09-23 NOTE — Assessment & Plan Note (Signed)
Summary: 3 MONTH FOLLOW UP/JRR   Vital Signs:  Patient profile:   77 year old male Weight:      169 pounds O2 Sat:      98 % on Room air Temp:     97.8 degrees F oral Pulse rate:   90 / minute Pulse rhythm:   regular Resp:     18 per minute BP sitting:   138 / 70  (left arm) Cuff size:   large  Vitals Entered By: Mervin Hack CMA Duncan Dull) (September 15, 2010 11:31 AM)  O2 Flow:  Room air CC: follow-up for SOB   History of Present Illness: still has cough Has still been taking only 1 puff of q-var two times a day doesn't think the proair helps at all  Coughs at night --affects him going to sleep proair at bedtime hasn't helped  Daytime symptoms are not that bad mostly the trouble is at night No heartburn  Allergies: No Known Drug Allergies  Past History:  Past medical, surgical, family and social histories (including risk factors) reviewed for relevance to current acute and chronic problems.  Past Medical History: Reviewed history from 07/19/2009 and no changes required. Cataract OD Gastric Ulcers Cataract Removal OS Atrial fibrillation COPD  Past Surgical History: Reviewed history from 07/19/2009 and no changes required. 1977- Gastric Ulcers with hemorrhage and surgical repair 1983- laceration right wrist  2004- Cataract removal 2007 Right eye cataract  Family History: Reviewed history from 07/19/2009 and no changes required. Dad died of kidney trouble Mom died of stroke CAD, HTN run in family No cancer  Social History: Reviewed history from 07/19/2009 and no changes required. Retired--textile worker Widower 2 daughters, 1 son Current Smoker--intermittent Lives with oldest daughter  Has DNR--reviewed and he still wants this  Review of Systems       appetite is good weight is stable  Physical Exam  General:  alert and normal appearance.   Neck:  supple, no masses, and no cervical lymphadenopathy.   Lungs:  normal respiratory effort, no  intercostal retractions, and no accessory muscle use.   decreased breath sounds with mild exp wheezing slight increased exp phase   Impression & Recommendations:  Problem # 1:  COPD (ICD-496) Assessment Unchanged proair not much help  he never increased the qvar I discussed pulmonary evaluation and he adamantly prefers no other doctors for now will increase the qvar and consider long acting beta agonist  His updated medication list for this problem includes:    Qvar 40 Mcg/act Aers (Beclomethasone dipropionate) .[name]... 2 inhalations  twice daily with spacer. rinse mouth after    Ventolin Hfa 108 (90 Base) Mcg/act Aers (Albuterol sulfate) .[name]... 2 inhalations three times a day as needed for cough or wheezing. use with spacer  Complete Medication List: 1)  Warfarin Sodium 5 Mg Tabs (Warfarin sodium) .... Use as directed 2)  Furosemide 20 Mg Tabs (Furosemide) .[name].. 1 daily to reduce swelling 3)  Qvar 40 Mcg/act Aers (Beclomethasone dipropionate) .... 2 inhalations  twice daily with spacer. rinse mouth after 4)  Warfarin Sodium 7.5 Mg Tabs (Warfarin sodium) .... Take as directed 5)  Ventolin Hfa 108 (90 Base) Mcg/act Aers (Albuterol sulfate) .... 2 inhalations three times a day as needed for cough or wheezing. use with spacer  Patient Instructions: 1)  Please take 2 puffs of the qvar two times a day  2)  Continue the proair  two times a day also 3)  Please schedule a follow-up appointment  in 2 months.    Orders Added: 1)  Est. Patient Level III [[address]]    Current Allergies (reviewed today): No known allergies

## 2010-09-23 NOTE — Medication Information (Signed)
Summary: protime/tw   Indication 1: Atrial fibrillation PT 28.4 INR RANGE 2.0-3.0           Allergies: No Known Drug Allergies  Anticoagulation Management History:      Positive risk factors for bleeding include an age of 60 years or older.  The bleeding index is 'intermediate risk'.  Positive CHADS2 values include Age > 75 years old.  His last INR was 2.8 and today's INR is 2.4.  Prothrombin time is 28.4.    Anticoagulation Management Assessment/Plan:      The patient's current anticoagulation dose is Warfarin sodium 5 mg tabs: use as directed, Warfarin sodium 7.5 mg tabs: take as directed.  The next INR is due 4 weeks.        Laboratory Results   Blood Tests   Date/Time Recieved: September 15, 2010 12:03 PM  Date/Time Reported: September 15, 2010 12:03 PM   PT: 28.4 s   (Normal Range: 10.6-13.4)  INR: 2.4   (Normal Range: 0.88-1.12   Therap INR: 2.0-3.5)      ANTICOAGULATION RECORD PREVIOUS REGIMEN & LAB RESULTS Anticoagulation Diagnosis:  Atrial fibrillation on  10/22/2009 Previous INR Goal Range:  2.0-3.0 on  07/22/2009 Previous INR:  2.8 on  08/12/2010 Previous Coumadin Dose(mg):  7.5mg  daily, 5mg  dat on  05/16/2010 Previous Regimen:  7.5 mg daily, 5mg  sat on  05/16/2010 Previous Coagulation Comments:  . on  10/08/2009  NEW REGIMEN & LAB RESULTS Current INR: 2.4 Regimen: 7.5 mg daily, 5mg  sat  (no change)  Provider: Gilbert Narain      Repeat testing in: 4 weeks MEDICATIONS WARFARIN SODIUM 5 MG TABS (WARFARIN SODIUM) use as directed FUROSEMIDE 20 MG TABS (FUROSEMIDE) 1 daily to reduce swelling QVAR 40 MCG/ACT AERS (BECLOMETHASONE DIPROPIONATE) 2 inhalations  twice daily with spacer. Rinse mouth after WARFARIN SODIUM 7.5 MG TABS (WARFARIN SODIUM) take as directed VENTOLIN HFA 108 (90 BASE) MCG/ACT AERS (ALBUTEROL SULFATE) 2 inhalations three times a day as needed for cough or wheezing. Use with spacer  Dose has been reviewed with patient or caretaker during this  visit.  Reviewed by: Allison Quarry  Anticoagulation Visit Questionnaire      Coumadin dose missed/changed:  No      Abnormal Bleeding Symptoms:  No Any diet changes including alcohol intake, vegetables or greens since the last visit:  No Any illnesses or hospitalizations since the last visit:  No Any signs of clotting since the last visit (including chest discomfort, dizziness, shortness of breath, arm tingling, slurred speech, swelling or redness in leg):  No

## 2010-10-02 ENCOUNTER — Telehealth: Payer: Self-pay | Admitting: Radiology

## 2010-10-02 DIAGNOSIS — Z5181 Encounter for therapeutic drug level monitoring: Secondary | ICD-10-CM

## 2010-10-02 DIAGNOSIS — I4891 Unspecified atrial fibrillation: Secondary | ICD-10-CM

## 2010-10-02 DIAGNOSIS — Z7901 Long term (current) use of anticoagulants: Secondary | ICD-10-CM

## 2010-10-02 NOTE — Telephone Encounter (Signed)
Please sign and close standing order for INR  

## 2010-10-13 ENCOUNTER — Ambulatory Visit (INDEPENDENT_AMBULATORY_CARE_PROVIDER_SITE_OTHER): Payer: MEDICARE | Admitting: Internal Medicine

## 2010-10-13 DIAGNOSIS — I4891 Unspecified atrial fibrillation: Secondary | ICD-10-CM

## 2010-10-13 DIAGNOSIS — Z7901 Long term (current) use of anticoagulants: Secondary | ICD-10-CM

## 2010-10-13 DIAGNOSIS — Z5181 Encounter for therapeutic drug level monitoring: Secondary | ICD-10-CM

## 2010-10-26 ENCOUNTER — Other Ambulatory Visit: Payer: Self-pay | Admitting: Internal Medicine

## 2010-10-28 ENCOUNTER — Other Ambulatory Visit: Payer: Self-pay | Admitting: Internal Medicine

## 2010-11-18 ENCOUNTER — Encounter: Payer: Self-pay | Admitting: Internal Medicine

## 2010-11-19 ENCOUNTER — Encounter: Payer: Self-pay | Admitting: Internal Medicine

## 2010-11-19 ENCOUNTER — Ambulatory Visit (INDEPENDENT_AMBULATORY_CARE_PROVIDER_SITE_OTHER): Payer: MEDICARE | Admitting: Internal Medicine

## 2010-11-19 VITALS — BP 138/80 | HR 72 | Temp 97.6°F | Ht 72.0 in | Wt 167.0 lb

## 2010-11-19 DIAGNOSIS — I4891 Unspecified atrial fibrillation: Secondary | ICD-10-CM

## 2010-11-19 DIAGNOSIS — R609 Edema, unspecified: Secondary | ICD-10-CM

## 2010-11-19 DIAGNOSIS — Z5181 Encounter for therapeutic drug level monitoring: Secondary | ICD-10-CM

## 2010-11-19 DIAGNOSIS — Z7901 Long term (current) use of anticoagulants: Secondary | ICD-10-CM

## 2010-11-19 DIAGNOSIS — J4489 Other specified chronic obstructive pulmonary disease: Secondary | ICD-10-CM

## 2010-11-19 DIAGNOSIS — J449 Chronic obstructive pulmonary disease, unspecified: Secondary | ICD-10-CM

## 2010-11-19 LAB — POCT INR: INR: 2.7

## 2010-11-19 LAB — BASIC METABOLIC PANEL
CO2: 31 mEq/L (ref 19–32)
Chloride: 104 mEq/L (ref 96–112)
Creatinine, Ser: 0.8 mg/dL (ref 0.4–1.5)
Potassium: 4.3 mEq/L (ref 3.5–5.1)
Sodium: 140 mEq/L (ref 135–145)

## 2010-11-19 NOTE — Progress Notes (Signed)
  Subjective:    Patient ID: [name] [name], male    DOB: 1920-11-28, 77 y.o.   MRN: [id number]  HPI Breathing is "sometimes good, sometimes bad" Gets out of breath with activity Able to shop for food Daughter does laundry Not really walking----just piddling on lawnmower, etc Not clear if q-var regularly helped Proair does help Cough at night will keep him up at times Satisfied now--doesn't want to try something new  No heart trouble No palpitations No chest pain  No ankle edema Takes the furosemide regularly  Voids okay Bowels okay  Current outpatient prescriptions:albuterol (VENTOLIN HFA) 108 (90 BASE) MCG/ACT inhaler, Inhale 2 puffs into the lungs 3 (three) times daily as needed.  , Disp: , Rfl: ;  furosemide (LASIX) 20 MG tablet, TAKE 1 TABLET BY MOUTH EVERY DAY TO REDUCE SWELLING, Disp: 30 tablet, Rfl: 11;  QVAR 40 MCG/ACT inhaler, Inhale 2 puffs into the lungs 2 (two) times daily. , Disp: , Rfl: ;  warfarin (COUMADIN) 5 MG tablet, as directed.  , Disp: , Rfl:  warfarin (COUMADIN) 7.5 MG tablet, TAKE AS DIRECTED, Disp: 60 tablet, Rfl: 7  Past Medical History  Diagnosis Date  . Cataract     2004- Cataract removal, 2007 Right eye cataract  . COPD (chronic obstructive pulmonary disease)   . Gastric ulcer     1977- Gastric Ulcers with hemorrhage and surgical repair  . Atrial fibrillation     Past Surgical History  Procedure Date  . Laceration repair     1983- laceration right wrist     Family History  Problem Relation Age of Onset  . Stroke Mother   . Cancer Neg Hx     History   Social History  . Marital Status: Widowed    Spouse Name: N/A    Number of Children: 3  . Years of Education: N/A   Occupational History  . retired Scientist, product/process development    Social History Main Topics  . Smoking status: Current Some Day Smoker  . Smokeless tobacco: Not on file  . Alcohol Use: Not on file  . Drug Use: Not on file  . Sexually Active: Not on file   Other Topics Concern    . Not on file   Social History Narrative   Lives with oldest daughterHas DNR--reviewed and he still wants this   Review of Systems Appetite is okay Weight is stable Having sleep problems--trouble initiating due to cough No allergy problems    Objective:   Physical Exam  Constitutional: He appears well-developed and well-nourished. No distress.  Neck: Normal range of motion. Neck supple.  Cardiovascular: Normal rate and normal heart sounds.  Exam reveals no gallop.   No murmur heard.      Slightly irregular  Pulmonary/Chest: Effort normal. No respiratory distress. He has wheezes. He has no rales.       Slightly decreased breath sounds Mild increased exp phase with slight exp wheezing  Musculoskeletal: Normal range of motion. He exhibits no edema and no tenderness.  Lymphadenopathy:    He has no cervical adenopathy.  Psychiatric: He has a normal mood and affect. His behavior is normal. Judgment and thought content normal.          Assessment & Plan:

## 2010-11-19 NOTE — Patient Instructions (Signed)
Continue current dose, check in 4 weeks  

## 2010-11-24 ENCOUNTER — Other Ambulatory Visit: Payer: Self-pay | Admitting: *Deleted

## 2010-11-24 MED ORDER — WARFARIN SODIUM 5 MG PO TABS: 5.0000 mg | ORAL_TABLET | ORAL | Status: DC

## 2010-12-17 ENCOUNTER — Ambulatory Visit (INDEPENDENT_AMBULATORY_CARE_PROVIDER_SITE_OTHER): Payer: Medicare Other | Admitting: Internal Medicine

## 2010-12-17 DIAGNOSIS — Z5181 Encounter for therapeutic drug level monitoring: Secondary | ICD-10-CM

## 2010-12-17 DIAGNOSIS — I4891 Unspecified atrial fibrillation: Secondary | ICD-10-CM

## 2010-12-17 DIAGNOSIS — Z7901 Long term (current) use of anticoagulants: Secondary | ICD-10-CM

## 2010-12-17 LAB — POCT INR: INR: 2.5

## 2010-12-17 NOTE — Patient Instructions (Addendum)
Continue current dose of 7.5 mg daily, except 5 mg Sat , recheck in 4weeks

## 2011-01-14 ENCOUNTER — Ambulatory Visit (INDEPENDENT_AMBULATORY_CARE_PROVIDER_SITE_OTHER): Payer: Medicare Other | Admitting: Internal Medicine

## 2011-01-14 DIAGNOSIS — I4891 Unspecified atrial fibrillation: Secondary | ICD-10-CM

## 2011-01-14 DIAGNOSIS — Z7901 Long term (current) use of anticoagulants: Secondary | ICD-10-CM

## 2011-01-14 DIAGNOSIS — Z5181 Encounter for therapeutic drug level monitoring: Secondary | ICD-10-CM

## 2011-01-14 NOTE — Patient Instructions (Signed)
Take 10 mg today and tomorrow then resume 7.5 mg daily, 5 mg Sat.

## 2011-01-28 ENCOUNTER — Ambulatory Visit (INDEPENDENT_AMBULATORY_CARE_PROVIDER_SITE_OTHER): Payer: Medicare Other | Admitting: Family Medicine

## 2011-01-28 DIAGNOSIS — I4891 Unspecified atrial fibrillation: Secondary | ICD-10-CM

## 2011-01-28 DIAGNOSIS — Z7901 Long term (current) use of anticoagulants: Secondary | ICD-10-CM

## 2011-01-28 DIAGNOSIS — Z5181 Encounter for therapeutic drug level monitoring: Secondary | ICD-10-CM

## 2011-01-28 NOTE — Patient Instructions (Signed)
Continue 7.5 mg daily, recheck in 2 weeks

## 2011-02-11 ENCOUNTER — Ambulatory Visit (INDEPENDENT_AMBULATORY_CARE_PROVIDER_SITE_OTHER): Payer: Medicare Other | Admitting: Internal Medicine

## 2011-02-11 DIAGNOSIS — Z7901 Long term (current) use of anticoagulants: Secondary | ICD-10-CM

## 2011-02-11 DIAGNOSIS — Z5181 Encounter for therapeutic drug level monitoring: Secondary | ICD-10-CM

## 2011-02-11 DIAGNOSIS — I4891 Unspecified atrial fibrillation: Secondary | ICD-10-CM

## 2011-02-11 LAB — POCT INR: INR: 2.4

## 2011-02-11 NOTE — Patient Instructions (Signed)
Continue current dose, check in 4 weeks  

## 2011-03-11 ENCOUNTER — Ambulatory Visit (INDEPENDENT_AMBULATORY_CARE_PROVIDER_SITE_OTHER): Payer: Medicare Other | Admitting: Internal Medicine

## 2011-03-11 DIAGNOSIS — Z7901 Long term (current) use of anticoagulants: Secondary | ICD-10-CM

## 2011-03-11 DIAGNOSIS — I4891 Unspecified atrial fibrillation: Secondary | ICD-10-CM

## 2011-03-11 DIAGNOSIS — Z5181 Encounter for therapeutic drug level monitoring: Secondary | ICD-10-CM

## 2011-03-11 NOTE — Patient Instructions (Signed)
Continue current dose, check in 4 weeks  

## 2011-04-08 ENCOUNTER — Ambulatory Visit (INDEPENDENT_AMBULATORY_CARE_PROVIDER_SITE_OTHER): Payer: Medicare Other | Admitting: Internal Medicine

## 2011-04-08 DIAGNOSIS — Z7901 Long term (current) use of anticoagulants: Secondary | ICD-10-CM

## 2011-04-08 DIAGNOSIS — Z5181 Encounter for therapeutic drug level monitoring: Secondary | ICD-10-CM

## 2011-04-08 DIAGNOSIS — I4891 Unspecified atrial fibrillation: Secondary | ICD-10-CM

## 2011-04-08 LAB — POCT INR: INR: 2.9

## 2011-04-08 NOTE — Patient Instructions (Addendum)
Continue 7.5 mg daily, recheck 6 weeks at next Dr appt

## 2011-05-18 ENCOUNTER — Ambulatory Visit: Payer: Self-pay | Admitting: Internal Medicine

## 2011-05-18 DIAGNOSIS — I4891 Unspecified atrial fibrillation: Secondary | ICD-10-CM

## 2011-05-18 NOTE — Patient Instructions (Signed)
Patient scheduled for next inr 05-22-11

## 2011-05-22 ENCOUNTER — Ambulatory Visit (INDEPENDENT_AMBULATORY_CARE_PROVIDER_SITE_OTHER): Payer: Medicare Other | Admitting: Internal Medicine

## 2011-05-22 ENCOUNTER — Telehealth: Payer: Self-pay | Admitting: Radiology

## 2011-05-22 ENCOUNTER — Encounter: Payer: Self-pay | Admitting: Internal Medicine

## 2011-05-22 DIAGNOSIS — I4891 Unspecified atrial fibrillation: Secondary | ICD-10-CM

## 2011-05-22 DIAGNOSIS — R609 Edema, unspecified: Secondary | ICD-10-CM

## 2011-05-22 DIAGNOSIS — Z7901 Long term (current) use of anticoagulants: Secondary | ICD-10-CM

## 2011-05-22 DIAGNOSIS — Z5181 Encounter for therapeutic drug level monitoring: Secondary | ICD-10-CM

## 2011-05-22 DIAGNOSIS — J449 Chronic obstructive pulmonary disease, unspecified: Secondary | ICD-10-CM

## 2011-05-22 LAB — TSH: TSH: 1.08 u[IU]/mL (ref 0.35–5.50)

## 2011-05-22 LAB — CBC WITH DIFFERENTIAL/PLATELET
Basophils Relative: 0.4 % (ref 0.0–3.0)
Eosinophils Relative: 1.4 % (ref 0.0–5.0)
HCT: 43.4 % (ref 39.0–52.0)
Hemoglobin: 14.8 g/dL (ref 13.0–17.0)
Lymphs Abs: 1.7 10*3/uL (ref 0.7–4.0)
Monocytes Relative: 7.6 % (ref 3.0–12.0)
Neutro Abs: 3.9 10*3/uL (ref 1.4–7.7)
RBC: 4.43 Mil/uL (ref 4.22–5.81)
RDW: 13.6 % (ref 11.5–14.6)
WBC: 6.2 10*3/uL (ref 4.5–10.5)

## 2011-05-22 LAB — HEPATIC FUNCTION PANEL
Alkaline Phosphatase: 94 U/L (ref 39–117)
Bilirubin, Direct: 0.1 mg/dL (ref 0.0–0.3)

## 2011-05-22 LAB — BASIC METABOLIC PANEL
GFR: 98.28 mL/min (ref >60.00)
Glucose, Bld: 46 mg/dL — CL (ref 70–99)
Potassium: 3.5 mEq/L (ref 3.5–5.1)
Sodium: 140 mEq/L (ref 135–145)

## 2011-05-22 MED ORDER — MOMETASONE FURO-FORMOTEROL FUM 200-5 MCG/ACT IN AERO: 1.0000 | INHALATION_SPRAY | Freq: Two times a day (BID) | RESPIRATORY_TRACT | Status: DC

## 2011-05-22 NOTE — Assessment & Plan Note (Signed)
worsened now Failed spiriva in past proair not helping much Not able to sleep and decreased functional status  Will change to advair Prednisone burst Recheck 6 weeks

## 2011-05-22 NOTE — Patient Instructions (Signed)
Continue 7.5 mg daily , recheck 4 weeks 

## 2011-05-22 NOTE — Patient Instructions (Signed)
Please stop the Qvar Start dulera-- 1 puff twice a day

## 2011-05-22 NOTE — Assessment & Plan Note (Signed)
Good rate control Remains on coumadin 

## 2011-05-22 NOTE — Progress Notes (Signed)
  Subjective:    Patient ID: [name] [name], male    DOB: 09/09/1920, 77 y.o.   MRN: [id number]  HPI Having cough and breathing problems Ongoing issue Affects his sleep Still uses riding mower--can't walk far without dyspnea Trouble carrying groceries proair no longer helping much  No fever Cough is only occ productive Doesn't seem to have infection  No heart trouble No chest pain No palpitations  Edema seems to be gone  Current Outpatient Prescriptions on File Prior to Visit  Medication Sig Dispense Refill  . albuterol (VENTOLIN HFA) 108 (90 BASE) MCG/ACT inhaler Inhale 2 puffs into the lungs every 4 (four) hours as needed.       . furosemide (LASIX) 20 MG tablet TAKE 1 TABLET BY MOUTH EVERY DAY TO REDUCE SWELLING  30 tablet  11  . QVAR 40 MCG/ACT inhaler Inhale 2 puffs into the lungs 2 (two) times daily.       [name] warfarin (COUMADIN) 5 MG tablet Take 1 tablet (5 mg total) by mouth as directed.  30 tablet  6  . warfarin (COUMADIN) 7.5 MG tablet TAKE AS DIRECTED  60 tablet  7    No Known Allergies  Past Medical History  Diagnosis Date  . Cataract     2004- Cataract removal, 2007 Right eye cataract  . COPD (chronic obstructive pulmonary disease)   . Gastric ulcer     1977- Gastric Ulcers with hemorrhage and surgical repair  . Atrial fibrillation     Past Surgical History  Procedure Date  . Laceration repair     1983- laceration right wrist     Family History  Problem Relation Age of Onset  . Stroke Mother   . Cancer Neg Hx     History   Social History  . Marital Status: Widowed    Spouse Name: N/A    Number of Children: 3  . Years of Education: N/A   Occupational History  . retired Scientist, product/process development    Social History Main Topics  . Smoking status: Former Smoker    Quit date: 03/07/2011  . Smokeless tobacco: Never Used  . Alcohol Use: Not on file  . Drug Use: Not on file  . Sexually Active: Not on file   Other Topics Concern  . Not on file   Social  History Narrative   Lives with oldest daughterHas DNR--reviewed and he still wants this   Review of Systems No trouble voiding  No constipation Weight is stable Appetite is fine    Objective:   Physical Exam  Constitutional: He appears well-developed and well-nourished. No distress.  Neck: Normal range of motion. No thyromegaly present.  Cardiovascular: Normal rate and normal heart sounds.  Exam reveals no gallop.   No murmur heard.      irregular  Pulmonary/Chest: Effort normal. No respiratory distress. He has no rales.       Decreased breath sounds Mild increased exp phase with exp wheezes and rhonchi  Musculoskeletal: Normal range of motion. He exhibits no edema and no tenderness.  Lymphadenopathy:    He has no cervical adenopathy.  Psychiatric: He has a normal mood and affect. His behavior is normal. Judgment and thought content normal.          Assessment & Plan:

## 2011-05-22 NOTE — Telephone Encounter (Signed)
Patient notified. He had not eaten but I advised he needed to do so now. (I advised a peanut butter sandwich if able).  I also advised if he had any problems to go to the ER. He verbalized understanding.

## 2011-05-22 NOTE — Telephone Encounter (Addendum)
Please call pt to see how he's doing - ensure no sxs, ensure has recently eaten meal since blood checked. Not diabetic.

## 2011-05-22 NOTE — Telephone Encounter (Signed)
Noted thanks °

## 2011-05-22 NOTE — Telephone Encounter (Signed)
[facility] called a critical Glucose, 46

## 2011-05-22 NOTE — Assessment & Plan Note (Signed)
controlled with the lasix

## 2011-05-23 NOTE — Telephone Encounter (Signed)
Was asymptomatic during the visit

## 2011-07-03 ENCOUNTER — Encounter: Payer: Self-pay | Admitting: Internal Medicine

## 2011-07-03 ENCOUNTER — Ambulatory Visit (INDEPENDENT_AMBULATORY_CARE_PROVIDER_SITE_OTHER): Payer: Medicare Other | Admitting: Internal Medicine

## 2011-07-03 VITALS — BP 140/80 | HR 62 | Temp 98.0°F | Ht 72.0 in | Wt 171.0 lb

## 2011-07-03 DIAGNOSIS — Z7901 Long term (current) use of anticoagulants: Secondary | ICD-10-CM

## 2011-07-03 DIAGNOSIS — J449 Chronic obstructive pulmonary disease, unspecified: Secondary | ICD-10-CM

## 2011-07-03 DIAGNOSIS — Z5181 Encounter for therapeutic drug level monitoring: Secondary | ICD-10-CM

## 2011-07-03 DIAGNOSIS — I4891 Unspecified atrial fibrillation: Secondary | ICD-10-CM

## 2011-07-03 MED ORDER — ALBUTEROL SULFATE HFA 108 (90 BASE) MCG/ACT IN AERS: 2.0000 | INHALATION_SPRAY | RESPIRATORY_TRACT | Status: DC | PRN

## 2011-07-03 MED ORDER — BECLOMETHASONE DIPROPIONATE 40 MCG/ACT IN AERS: 2.0000 | INHALATION_SPRAY | Freq: Every day | RESPIRATORY_TRACT | Status: DC

## 2011-07-03 NOTE — Patient Instructions (Signed)
Please use the Qvar EVERY day--put it at the end of an empty toilet paper roll and it will get down into your lungs better Use the albuterol inhaler when you feel short of breath

## 2011-07-03 NOTE — Patient Instructions (Signed)
Hold today's dose then, 7.5 mg daily, recheck 2 weeks

## 2011-07-03 NOTE — Assessment & Plan Note (Signed)
Didn't like the dulera Doesn't understand how to use inhalers Still had the qvar and was using that prn Discussed how to use properly

## 2011-07-03 NOTE — Progress Notes (Signed)
  Subjective:    Patient ID: [name] [name], male    DOB: [birth year], 77 y.o.   MRN: [id number]  HPI Had to pay for the dulera---coupon had run out He didn't notice any difference on it Not much cough but feels congestion in throat/chest Has some wheeze  No problems walking around grocery store or carrying groceries Daughter does laundry but he washes dishes Does riding mower but can't do trimming,etc (dyspnea) Doesn't feel things are worse now  Did quit smoking but has had 1 recently Counseled on this  Current Outpatient Prescriptions on File Prior to Visit  Medication Sig Dispense Refill  . albuterol (VENTOLIN HFA) 108 (90 BASE) MCG/ACT inhaler Inhale 2 puffs into the lungs every 4 (four) hours as needed.       . furosemide (LASIX) 20 MG tablet TAKE 1 TABLET BY MOUTH EVERY DAY TO REDUCE SWELLING  30 tablet  11  . Mometasone Furo-Formoterol Fum 200-5 MCG/ACT AERO Inhale 1 puff into the lungs 2 (two) times daily.  1 Inhaler  11  . warfarin (COUMADIN) 7.5 MG tablet TAKE AS DIRECTED  60 tablet  7    No Known Allergies  Past Medical History  Diagnosis Date  . Cataract     2004- Cataract removal, 2007 Right eye cataract  . COPD (chronic obstructive pulmonary disease)   . Gastric ulcer     1977- Gastric Ulcers with hemorrhage and surgical repair  . Atrial fibrillation     Past Surgical History  Procedure Date  . Laceration repair     1983- laceration right wrist     Family History  Problem Relation Age of Onset  . Stroke Mother   . Cancer Neg Hx     History   Social History  . Marital Status: Widowed    Spouse Name: N/A    Number of Children: 3  . Years of Education: N/A   Occupational History  . retired Scientist, product/process development    Social History Main Topics  . Smoking status: Former Smoker    Quit date: 03/07/2011  . Smokeless tobacco: Never Used  . Alcohol Use: Not on file  . Drug Use: Not on file  . Sexually Active: Not on file   Other Topics Concern  . Not on  file   Social History Narrative   Lives with oldest daughterHas DNR--reviewed and he still wants this   Review of Systems Appetite is good Weight stable Ongoing sleep problems--no change     Objective:   Physical Exam  Constitutional: He appears well-developed and well-nourished. No distress.  Neck: Normal range of motion. Neck supple.  Pulmonary/Chest: Effort normal. No respiratory distress. He has wheezes. He has no rales.       Mild expiratory wheeze and slight expiratory phase prolongation  Lymphadenopathy:    He has no cervical adenopathy.  Psychiatric: He has a normal mood and affect.          Assessment & Plan:

## 2011-07-17 ENCOUNTER — Ambulatory Visit (INDEPENDENT_AMBULATORY_CARE_PROVIDER_SITE_OTHER): Payer: Medicare Other | Admitting: Internal Medicine

## 2011-07-17 ENCOUNTER — Encounter: Payer: Self-pay | Admitting: Internal Medicine

## 2011-07-17 VITALS — BP 128/70 | HR 90 | Temp 97.8°F | Ht 72.0 in | Wt 166.0 lb

## 2011-07-17 DIAGNOSIS — Z7901 Long term (current) use of anticoagulants: Secondary | ICD-10-CM

## 2011-07-17 DIAGNOSIS — I4891 Unspecified atrial fibrillation: Secondary | ICD-10-CM

## 2011-07-17 DIAGNOSIS — L989 Disorder of the skin and subcutaneous tissue, unspecified: Secondary | ICD-10-CM | POA: Insufficient documentation

## 2011-07-17 DIAGNOSIS — Z5181 Encounter for therapeutic drug level monitoring: Secondary | ICD-10-CM

## 2011-07-17 NOTE — Patient Instructions (Signed)
7.5 mg daily,  Except  5 mg every Tue / Thurs recheck 2 weeks ( reduced dose by 5 mg)

## 2011-07-17 NOTE — Progress Notes (Signed)
  Subjective:    Patient ID: [name] [name], male    DOB: May 09, 1921, 78 y.o.   MRN: [id number]  HPI Family is concerned about spot on scalp Started about a month ago but now larger No pain unless he hits No discharge that he knows of Hasn't really been inflamed that he can tell  Current Outpatient Prescriptions on File Prior to Visit  Medication Sig Dispense Refill  . albuterol (VENTOLIN HFA) 108 (90 BASE) MCG/ACT inhaler Inhale 2 puffs into the lungs every 4 (four) hours as needed.  1 Inhaler  3  . beclomethasone (QVAR) 40 MCG/ACT inhaler Inhale 2 puffs into the lungs daily.  1 Inhaler  11  . furosemide (LASIX) 20 MG tablet TAKE 1 TABLET BY MOUTH EVERY DAY TO REDUCE SWELLING  30 tablet  11  . warfarin (COUMADIN) 7.5 MG tablet TAKE AS DIRECTED  60 tablet  7    No Known Allergies  Past Medical History  Diagnosis Date  . Cataract     2004- Cataract removal, 2007 Right eye cataract  . COPD (chronic obstructive pulmonary disease)   . Gastric ulcer     1977- Gastric Ulcers with hemorrhage and surgical repair  . Atrial fibrillation     Past Surgical History  Procedure Date  . Laceration repair     1983- laceration right wrist     Family History  Problem Relation Age of Onset  . Stroke Mother   . Cancer Neg Hx     History   Social History  . Marital Status: Widowed    Spouse Name: N/A    Number of Children: 3  . Years of Education: N/A   Occupational History  . retired Scientist, product/process development    Social History Main Topics  . Smoking status: Former Smoker    Quit date: 03/07/2011  . Smokeless tobacco: Never Used  . Alcohol Use: Not on file  . Drug Use: Not on file  . Sexually Active: Not on file   Other Topics Concern  . Not on file   Social History Narrative   Lives with oldest daughterHas DNR--reviewed and he still wants this   Review of Systems No fever No changes in shampoo,etc    Objective:   Physical Exam  Skin:       Raised 12x8 lesion on left frontal  area Red, irregular base Black spot in middle No infected          Assessment & Plan:

## 2011-07-17 NOTE — Patient Instructions (Signed)
Please set up dermatology appointment

## 2011-07-17 NOTE — Assessment & Plan Note (Signed)
Probably carcinoma Will set up with dermatology

## 2011-07-31 ENCOUNTER — Ambulatory Visit: Payer: Medicare Other

## 2011-08-03 ENCOUNTER — Ambulatory Visit (INDEPENDENT_AMBULATORY_CARE_PROVIDER_SITE_OTHER): Payer: Medicare Other | Admitting: Internal Medicine

## 2011-08-03 DIAGNOSIS — I4891 Unspecified atrial fibrillation: Secondary | ICD-10-CM

## 2011-08-03 DIAGNOSIS — Z5181 Encounter for therapeutic drug level monitoring: Secondary | ICD-10-CM

## 2011-08-03 DIAGNOSIS — Z7901 Long term (current) use of anticoagulants: Secondary | ICD-10-CM

## 2011-08-03 LAB — POCT INR: INR: 3.7

## 2011-08-03 NOTE — Patient Instructions (Signed)
Hold tomorrows dose, already took it today) 7.5 mg daily,  Except  5 mg every Tue / Thurs/Sat recheck 2 weeks ( reduced dose by 2.5 mg weekly)

## 2011-08-17 ENCOUNTER — Ambulatory Visit (INDEPENDENT_AMBULATORY_CARE_PROVIDER_SITE_OTHER): Payer: Medicare Other | Admitting: Internal Medicine

## 2011-08-17 DIAGNOSIS — I4891 Unspecified atrial fibrillation: Secondary | ICD-10-CM

## 2011-08-17 DIAGNOSIS — Z5181 Encounter for therapeutic drug level monitoring: Secondary | ICD-10-CM

## 2011-08-17 DIAGNOSIS — Z7901 Long term (current) use of anticoagulants: Secondary | ICD-10-CM

## 2011-08-17 LAB — POCT INR: INR: 2.2

## 2011-08-17 NOTE — Patient Instructions (Addendum)
7.5 mg daily,  Except  5 mg every Tue / Thurs/Sat  Continue current dose, check in 4 weeks

## 2011-09-14 ENCOUNTER — Ambulatory Visit (INDEPENDENT_AMBULATORY_CARE_PROVIDER_SITE_OTHER): Payer: Medicare Other | Admitting: Family Medicine

## 2011-09-14 DIAGNOSIS — Z7901 Long term (current) use of anticoagulants: Secondary | ICD-10-CM

## 2011-09-14 DIAGNOSIS — Z5181 Encounter for therapeutic drug level monitoring: Secondary | ICD-10-CM

## 2011-09-14 DIAGNOSIS — I4891 Unspecified atrial fibrillation: Secondary | ICD-10-CM

## 2011-09-14 LAB — POCT INR: INR: 2.2

## 2011-09-14 NOTE — Patient Instructions (Signed)
7.5 mg daily,  Except  5 mg every Tue / Thurs/Sat

## 2011-10-12 ENCOUNTER — Ambulatory Visit (INDEPENDENT_AMBULATORY_CARE_PROVIDER_SITE_OTHER): Payer: Medicare Other | Admitting: Internal Medicine

## 2011-10-12 DIAGNOSIS — I4891 Unspecified atrial fibrillation: Secondary | ICD-10-CM

## 2011-10-12 DIAGNOSIS — Z7902 Long term (current) use of antithrombotics/antiplatelets: Secondary | ICD-10-CM

## 2011-10-12 DIAGNOSIS — Z5181 Encounter for therapeutic drug level monitoring: Secondary | ICD-10-CM

## 2011-10-12 DIAGNOSIS — Z7901 Long term (current) use of anticoagulants: Secondary | ICD-10-CM

## 2011-10-12 NOTE — Patient Instructions (Signed)
Continue current dose, check in 4 weeks  

## 2011-10-16 ENCOUNTER — Other Ambulatory Visit: Payer: Self-pay | Admitting: *Deleted

## 2011-10-16 MED ORDER — WARFARIN SODIUM 7.5 MG PO TABS: ORAL_TABLET | ORAL | Status: DC

## 2011-10-16 MED ORDER — FUROSEMIDE 20 MG PO TABS: ORAL_TABLET | ORAL | Status: DC

## 2011-11-09 ENCOUNTER — Ambulatory Visit (INDEPENDENT_AMBULATORY_CARE_PROVIDER_SITE_OTHER): Payer: Medicare Other | Admitting: Internal Medicine

## 2011-11-09 DIAGNOSIS — Z5181 Encounter for therapeutic drug level monitoring: Secondary | ICD-10-CM

## 2011-11-09 DIAGNOSIS — I4891 Unspecified atrial fibrillation: Secondary | ICD-10-CM

## 2011-11-09 DIAGNOSIS — Z7901 Long term (current) use of anticoagulants: Secondary | ICD-10-CM

## 2011-11-09 LAB — POCT INR: INR: 2

## 2011-11-09 NOTE — Patient Instructions (Signed)
5 mg daily,  Except  7. 5 mg every Tue / Thurs/Sat

## 2011-12-15 ENCOUNTER — Ambulatory Visit (INDEPENDENT_AMBULATORY_CARE_PROVIDER_SITE_OTHER): Payer: Medicare Other | Admitting: Internal Medicine

## 2011-12-15 ENCOUNTER — Encounter: Payer: Self-pay | Admitting: Internal Medicine

## 2011-12-15 VITALS — BP 138/80 | HR 53 | Temp 98.0°F | Ht 72.0 in | Wt 165.0 lb

## 2011-12-15 DIAGNOSIS — I4891 Unspecified atrial fibrillation: Secondary | ICD-10-CM

## 2011-12-15 DIAGNOSIS — J449 Chronic obstructive pulmonary disease, unspecified: Secondary | ICD-10-CM

## 2011-12-15 DIAGNOSIS — Z7901 Long term (current) use of anticoagulants: Secondary | ICD-10-CM

## 2011-12-15 DIAGNOSIS — R609 Edema, unspecified: Secondary | ICD-10-CM

## 2011-12-15 DIAGNOSIS — J4489 Other specified chronic obstructive pulmonary disease: Secondary | ICD-10-CM

## 2011-12-15 DIAGNOSIS — Z5181 Encounter for therapeutic drug level monitoring: Secondary | ICD-10-CM

## 2011-12-15 LAB — BASIC METABOLIC PANEL
CO2: 31 mEq/L (ref 19–32)
Calcium: 8.4 mg/dL (ref 8.4–10.5)
Creatinine, Ser: 0.8 mg/dL (ref 0.4–1.5)
GFR: 102.64 mL/min (ref >60.00)
Sodium: 142 mEq/L (ref 135–145)

## 2011-12-15 LAB — POCT INR: INR: 2.7

## 2011-12-15 NOTE — Progress Notes (Signed)
  Subjective:    Patient ID: [name] [name], male    DOB: [birth year], 78 y.o.   MRN: [id number]  HPI Did have skin cancer removed from forehead Had another lesion on face as well Keeping up with dermatologist  Using qvar regularly Helps breathing somewhat Uses the proair prn---didn't think it helped Tried it at night but wasn't helping his congestion and wheezing Keeps head of bed elevated  Still can do shopping and light housework Gets SOB if he pushes it  No palpitations No chest pain  Swelling is better Takes the fluid every day and it seems to be working  Current Outpatient Prescriptions on File Prior to Visit  Medication Sig Dispense Refill  . albuterol (VENTOLIN HFA) 108 (90 BASE) MCG/ACT inhaler Inhale 2 puffs into the lungs every 4 (four) hours as needed.  1 Inhaler  3  . beclomethasone (QVAR) 40 MCG/ACT inhaler Inhale 2 puffs into the lungs daily.  1 Inhaler  11  . furosemide (LASIX) 20 MG tablet Take one tablet by mouth daily to reduce swelling.  90 tablet  1  . warfarin (COUMADIN) 7.5 MG tablet Take by mouth as directed.  90 tablet  1    No Known Allergies  Past Medical History  Diagnosis Date  . Cataract     2004- Cataract removal, 2007 Right eye cataract  . COPD (chronic obstructive pulmonary disease)   . Gastric ulcer     1977- Gastric Ulcers with hemorrhage and surgical repair  . Atrial fibrillation     Past Surgical History  Procedure Date  . Laceration repair     1983- laceration right wrist     Family History  Problem Relation Age of Onset  . Stroke Mother   . Cancer Neg Hx     History   Social History  . Marital Status: Widowed    Spouse Name: N/A    Number of Children: 3  . Years of Education: N/A   Occupational History  . retired Scientist, product/process development    Social History Main Topics  . Smoking status: Former Smoker    Quit date: 03/07/2011  . Smokeless tobacco: Never Used  . Alcohol Use: Not on file  . Drug Use: Not on file  . Sexually  Active: Not on file   Other Topics Concern  . Not on file   Social History Narrative   Lives with oldest daughterHas DNR--reviewed and he still wants this   Review of Systems Sleeps fitfully but does okay with cat naps during the day Appetite is okay Weight is stable    Objective:   Physical Exam  Constitutional: He appears well-developed and well-nourished. No distress.  Neck: Normal range of motion. Neck supple.  Cardiovascular: Normal rate, regular rhythm and normal heart sounds.  Exam reveals no gallop.   No murmur heard. Pulmonary/Chest: Effort normal. No respiratory distress. He has wheezes. He has no rales.       Sig decreased breath sounds with exp rhonchi and wheeze Not overly prolonged exp phase  Musculoskeletal: He exhibits no edema and no tenderness.  Lymphadenopathy:    He has no cervical adenopathy.  Psychiatric: He has a normal mood and affect. His behavior is normal.          Assessment & Plan:

## 2011-12-15 NOTE — Assessment & Plan Note (Signed)
Regular now Good rate On coumadin

## 2011-12-15 NOTE — Patient Instructions (Signed)
Please use your spacer with the inhalers---every time you use them. Rinse your mouth after the qvar

## 2011-12-15 NOTE — Assessment & Plan Note (Signed)
Better on the lasix Probably helps his resp status Check met b

## 2011-12-15 NOTE — Patient Instructions (Signed)
Continue current dose, check in 4 weeks  

## 2011-12-15 NOTE — Assessment & Plan Note (Signed)
Moderately severe Didn't do well with dulera Now on qvar and does okay in day  Some ongoing night problems Discussed using spacer with inhalers Refer to Dr Kendrick Fries if he worsens

## 2011-12-18 ENCOUNTER — Encounter: Payer: Self-pay | Admitting: *Deleted

## 2012-01-01 ENCOUNTER — Other Ambulatory Visit: Payer: Self-pay | Admitting: *Deleted

## 2012-01-01 MED ORDER — WARFARIN SODIUM 5 MG PO TABS: 5.0000 mg | ORAL_TABLET | ORAL | Status: DC

## 2012-01-12 ENCOUNTER — Ambulatory Visit (INDEPENDENT_AMBULATORY_CARE_PROVIDER_SITE_OTHER): Payer: Medicare Other | Admitting: Internal Medicine

## 2012-01-12 DIAGNOSIS — I4891 Unspecified atrial fibrillation: Secondary | ICD-10-CM

## 2012-01-12 DIAGNOSIS — Z5181 Encounter for therapeutic drug level monitoring: Secondary | ICD-10-CM

## 2012-01-12 DIAGNOSIS — Z7901 Long term (current) use of anticoagulants: Secondary | ICD-10-CM

## 2012-01-12 NOTE — Patient Instructions (Signed)
Continue current dose, check in 4 weeks  

## 2012-02-09 ENCOUNTER — Ambulatory Visit (INDEPENDENT_AMBULATORY_CARE_PROVIDER_SITE_OTHER): Payer: Medicare Other | Admitting: Family Medicine

## 2012-02-09 DIAGNOSIS — Z7901 Long term (current) use of anticoagulants: Secondary | ICD-10-CM

## 2012-02-09 DIAGNOSIS — I4891 Unspecified atrial fibrillation: Secondary | ICD-10-CM

## 2012-02-09 DIAGNOSIS — Z5181 Encounter for therapeutic drug level monitoring: Secondary | ICD-10-CM

## 2012-02-09 LAB — POCT INR: INR: 1.9

## 2012-02-09 NOTE — Patient Instructions (Signed)
Continue current dose, check in 4 weeks  

## 2012-03-08 ENCOUNTER — Ambulatory Visit (INDEPENDENT_AMBULATORY_CARE_PROVIDER_SITE_OTHER): Payer: Medicare Other | Admitting: Internal Medicine

## 2012-03-08 DIAGNOSIS — Z5181 Encounter for therapeutic drug level monitoring: Secondary | ICD-10-CM

## 2012-03-08 DIAGNOSIS — Z7901 Long term (current) use of anticoagulants: Secondary | ICD-10-CM

## 2012-03-08 DIAGNOSIS — I4891 Unspecified atrial fibrillation: Secondary | ICD-10-CM

## 2012-03-08 LAB — POCT INR: INR: 2.4

## 2012-03-08 NOTE — Patient Instructions (Signed)
Continue current dose, check in 4 weeks  

## 2012-04-05 ENCOUNTER — Ambulatory Visit (INDEPENDENT_AMBULATORY_CARE_PROVIDER_SITE_OTHER): Payer: Medicare Other | Admitting: Internal Medicine

## 2012-04-05 DIAGNOSIS — I4891 Unspecified atrial fibrillation: Secondary | ICD-10-CM

## 2012-04-05 DIAGNOSIS — Z5181 Encounter for therapeutic drug level monitoring: Secondary | ICD-10-CM

## 2012-04-05 DIAGNOSIS — Z7901 Long term (current) use of anticoagulants: Secondary | ICD-10-CM

## 2012-04-05 NOTE — Patient Instructions (Signed)
Continue current dose, check in 4 weeks  

## 2012-04-17 ENCOUNTER — Other Ambulatory Visit: Payer: Self-pay | Admitting: Internal Medicine

## 2012-05-03 ENCOUNTER — Ambulatory Visit (INDEPENDENT_AMBULATORY_CARE_PROVIDER_SITE_OTHER): Payer: Medicare Other | Admitting: Internal Medicine

## 2012-05-03 DIAGNOSIS — I4891 Unspecified atrial fibrillation: Secondary | ICD-10-CM

## 2012-05-03 DIAGNOSIS — Z7901 Long term (current) use of anticoagulants: Secondary | ICD-10-CM

## 2012-05-03 DIAGNOSIS — Z5181 Encounter for therapeutic drug level monitoring: Secondary | ICD-10-CM

## 2012-05-03 NOTE — Patient Instructions (Signed)
Continue current dose, check in 4 weeks  

## 2012-05-31 ENCOUNTER — Ambulatory Visit (INDEPENDENT_AMBULATORY_CARE_PROVIDER_SITE_OTHER): Payer: Medicare Other | Admitting: Family Medicine

## 2012-05-31 DIAGNOSIS — I4891 Unspecified atrial fibrillation: Secondary | ICD-10-CM

## 2012-05-31 DIAGNOSIS — Z7901 Long term (current) use of anticoagulants: Secondary | ICD-10-CM

## 2012-05-31 DIAGNOSIS — Z5181 Encounter for therapeutic drug level monitoring: Secondary | ICD-10-CM

## 2012-05-31 LAB — POCT INR: INR: 3.5

## 2012-05-31 NOTE — Patient Instructions (Signed)
Hold tomorrows dose then resume 5 mg daily,  Except  7. 5 mg every Tue / Thurs/Sat . Recheck 2 weeks

## 2012-06-05 ENCOUNTER — Ambulatory Visit: Payer: Self-pay | Admitting: Internal Medicine

## 2012-06-07 ENCOUNTER — Inpatient Hospital Stay: Payer: Self-pay | Admitting: Specialist

## 2012-06-07 LAB — COMPREHENSIVE METABOLIC PANEL
Albumin: 3.6 g/dL (ref 3.4–5.0)
BUN: 12 mg/dL (ref 7–18)
Bilirubin,Total: 1.3 mg/dL — ABNORMAL HIGH (ref 0.2–1.0)
Chloride: 102 mmol/L (ref 98–107)
Co2: 28 mmol/L (ref 21–32)
Creatinine: 0.74 mg/dL (ref 0.60–1.30)
EGFR (African American): >60
EGFR (Non-African Amer.): >60
Glucose: 117 mg/dL — ABNORMAL HIGH (ref 65–99)
Osmolality: 271 (ref 275–301)
Potassium: 3.6 mmol/L (ref 3.5–5.1)
SGOT(AST): 30 U/L (ref 15–37)
SGPT (ALT): 35 U/L (ref 12–78)
Total Protein: 8.5 g/dL — ABNORMAL HIGH (ref 6.4–8.2)

## 2012-06-07 LAB — CBC
MCV: 97 fL (ref 80–100)
RBC: 4.46 10*6/uL (ref 4.40–5.90)
RDW: 13.1 % (ref 11.5–14.5)
WBC: 12 10*3/uL — ABNORMAL HIGH (ref 3.8–10.6)

## 2012-06-07 LAB — CK TOTAL AND CKMB (NOT AT ARMC)
CK, Total: 67 U/L (ref 35–232)
CK-MB: 1.4 ng/mL (ref 0.5–3.6)

## 2012-06-07 LAB — PROTIME-INR: INR: 1.5

## 2012-06-08 LAB — CBC WITH DIFFERENTIAL/PLATELET
Basophil %: 0.1 %
Eosinophil #: 0 10*3/uL (ref 0.0–0.7)
HCT: 38.7 % — ABNORMAL LOW (ref 40.0–52.0)
HGB: 12.9 g/dL — ABNORMAL LOW (ref 13.0–18.0)
Lymphocyte %: 4.6 %
MCH: 31.9 pg (ref 26.0–34.0)
MCHC: 33.3 g/dL (ref 32.0–36.0)
MCV: 96 fL (ref 80–100)
Monocyte #: 0.2 x10 3/mm (ref 0.2–1.0)
Monocyte %: 1.8 %
Neutrophil #: 9.1 10*3/uL — ABNORMAL HIGH (ref 1.4–6.5)

## 2012-06-08 LAB — BASIC METABOLIC PANEL
Anion Gap: 7 (ref 7–16)
Calcium, Total: 8.7 mg/dL (ref 8.5–10.1)
Chloride: 100 mmol/L (ref 98–107)
Co2: 26 mmol/L (ref 21–32)
EGFR (Non-African Amer.): >60
Osmolality: 269 (ref 275–301)
Potassium: 3.9 mmol/L (ref 3.5–5.1)
Sodium: 133 mmol/L — ABNORMAL LOW (ref 136–145)

## 2012-06-08 LAB — PROTIME-INR: Prothrombin Time: 17 secs — ABNORMAL HIGH (ref 11.5–14.7)

## 2012-06-09 LAB — PROTIME-INR: INR: 1.2

## 2012-06-10 LAB — PROTIME-INR: INR: 1.7

## 2012-06-11 LAB — PROTIME-INR
INR: 2.3
Prothrombin Time: 25.6 secs — ABNORMAL HIGH (ref 11.5–14.7)

## 2012-06-12 LAB — PROTIME-INR
INR: 3.3
Prothrombin Time: 33.5 secs — ABNORMAL HIGH (ref 11.5–14.7)

## 2012-06-13 ENCOUNTER — Telehealth: Payer: Self-pay

## 2012-06-13 LAB — CULTURE, BLOOD (SINGLE)

## 2012-06-13 LAB — PROTIME-INR
INR: 3.5
Prothrombin Time: 35.2 secs — ABNORMAL HIGH (ref 11.5–14.7)

## 2012-06-13 NOTE — Telephone Encounter (Signed)
[name] [name] with [facility]; pt discharged from [facility] today with referral to home health for nurse and PT services. Pt has COPD and respiratory failure. Pt has new home oxygen. Wanted verbal orders from Dr Alphonsus Sias [place] for nurse and PT services.Please advise.

## 2012-06-13 NOTE — Telephone Encounter (Signed)
That is fine He already has a follow up appt

## 2012-06-14 NOTE — Telephone Encounter (Signed)
Spoke with hospice nurse and advised results  

## 2012-06-15 ENCOUNTER — Ambulatory Visit (INDEPENDENT_AMBULATORY_CARE_PROVIDER_SITE_OTHER): Payer: Medicare Other | Admitting: Internal Medicine

## 2012-06-15 ENCOUNTER — Ambulatory Visit: Payer: Medicare Other | Admitting: Internal Medicine

## 2012-06-15 ENCOUNTER — Encounter: Payer: Self-pay | Admitting: Internal Medicine

## 2012-06-15 VITALS — BP 120/60 | HR 76 | Temp 98.5°F | Wt 149.0 lb

## 2012-06-15 DIAGNOSIS — Z5181 Encounter for therapeutic drug level monitoring: Secondary | ICD-10-CM

## 2012-06-15 DIAGNOSIS — J441 Chronic obstructive pulmonary disease with (acute) exacerbation: Secondary | ICD-10-CM

## 2012-06-15 DIAGNOSIS — I4891 Unspecified atrial fibrillation: Secondary | ICD-10-CM

## 2012-06-15 DIAGNOSIS — I10 Essential (primary) hypertension: Secondary | ICD-10-CM

## 2012-06-15 DIAGNOSIS — Z7901 Long term (current) use of anticoagulants: Secondary | ICD-10-CM

## 2012-06-15 NOTE — Assessment & Plan Note (Signed)
Hadn't been an issue till this hospitalization On meds for the rapid atrial fib If improves, would first wean beta blocker

## 2012-06-15 NOTE — Assessment & Plan Note (Addendum)
Seriously ill May have been related to restarting smoking and using kerosene heat--now staying with granddaughter Finishing out prednisone and levaquin Inhalers changed and increased meds Rx written for humidifier for oxygen circuit----it has been drying him out

## 2012-06-15 NOTE — Assessment & Plan Note (Signed)
Had rapid rates in hospital Needed increased meds--will continue Recheck coumadin today

## 2012-06-15 NOTE — Progress Notes (Signed)
Subjective:    Patient ID: [name] [name], male    DOB: Oct 30, 1920, 78 y.o.   MRN: [id number]  HPI Here with granddaughter and her husband Staying with her for now  Hospitalized with respiratory failure Had quit smoking last year--then went back on Now stopped again  Was very sick but able to get home 2 days ago Now on 24 hour oxygen Still with cough--now dry On prednisone wean Finishing up with the levaquin Inhalers were changed  Had paroxysms of atrial fib Increased meds for this and blood pressure Now no palpitations No chest pain No edema  Current Outpatient Prescriptions on File Prior to Visit  Medication Sig Dispense Refill  . ADVAIR DISKUS 250-50 MCG/DOSE AEPB Take 1 puff by mouth 2 (two) times daily.       [name] albuterol (VENTOLIN HFA) 108 (90 BASE) MCG/ACT inhaler Inhale 2 puffs into the lungs every 4 (four) hours as needed.  1 Inhaler  3  . beclomethasone (QVAR) 40 MCG/ACT inhaler Inhale 2 puffs into the lungs daily.  1 Inhaler  11  . diltiazem (CARDIZEM) 120 MG tablet Take 120 mg by mouth daily.       . furosemide (LASIX) 20 MG tablet TAKE ONE TABLET BY MOUTH ONCE A DAY TO REDUCE SWELLING.  90 tablet  1  . metoprolol (LOPRESSOR) 50 MG tablet Take 50 mg by mouth 2 (two) times daily.       [name] SPIRIVA HANDIHALER 18 MCG inhalation capsule Place 18 mcg into inhaler and inhale daily.       [name] warfarin (COUMADIN) 5 MG tablet Take 1 tablet (5 mg total) by mouth as directed.  30 tablet  11  . warfarin (COUMADIN) 7.5 MG tablet Take by mouth as directed.  90 tablet  1    No Known Allergies  Past Medical History  Diagnosis Date  . Cataract     2004- Cataract removal, 2007 Right eye cataract  . COPD (chronic obstructive pulmonary disease)   . Gastric ulcer     1977- Gastric Ulcers with hemorrhage and surgical repair  . Atrial fibrillation   . Hypertension     Past Surgical History  Procedure Date  . Laceration repair     1983- laceration right wrist     Family History   Problem Relation Age of Onset  . Stroke Mother   . Cancer Neg Hx     History   Social History  . Marital Status: Widowed    Spouse Name: N/A    Number of Children: 3  . Years of Education: N/A   Occupational History  . retired Scientist, product/process development    Social History Main Topics  . Smoking status: Former Smoker    Quit date: 03/07/2011  . Smokeless tobacco: Never Used  . Alcohol Use: Not on file  . Drug Use: Not on file  . Sexually Active: Not on file   Other Topics Concern  . Not on file   Social History Narrative   Lives with oldest daughterHas DNR--reviewed and he still wants this   Review of Systems Weight is down despite eating with the steroids Appetite is decreasing Sleep is okay per granddaughter--he is dissatisfied because he doesn't sleep more than 2 hours without awakening     Objective:   Physical Exam  Constitutional: He appears well-developed. No distress.  Neck: Normal range of motion. Neck supple.  Cardiovascular: Normal rate and normal heart sounds.  Exam reveals no gallop.   No murmur heard.  irregular  Pulmonary/Chest: Effort normal. No respiratory distress. He has wheezes. He has no rales.       Some increased expiratory phase and mild exp wheezing still  Abdominal: Soft. There is no tenderness.  Musculoskeletal: He exhibits no edema.  Lymphadenopathy:    He has no cervical adenopathy.          Assessment & Plan:

## 2012-06-15 NOTE — Addendum Note (Signed)
Addended by: Alvina Chou on: 06/15/2012 11:24 AM   Modules accepted: Orders

## 2012-06-16 ENCOUNTER — Telehealth: Payer: Self-pay

## 2012-06-16 NOTE — Telephone Encounter (Signed)
We just did his protime yesterday and he doesn't need it again till next week. We were planning to do that here  Okay to Rx albuterol inhaler #1 x 2 2 puffs 4 times daily prn Needs to use with spacer

## 2012-06-16 NOTE — Telephone Encounter (Signed)
Misty nurse with [facility] left v/m requesting order to draw a PT/INR. Misty wanted to know if Dr Alphonsus Sias would prescribe pt a rescue or albuterol inhaler. Misty request call back.

## 2012-06-17 MED ORDER — ALBUTEROL SULFATE HFA 108 (90 BASE) MCG/ACT IN AERS: 2.0000 | INHALATION_SPRAY | RESPIRATORY_TRACT | Status: DC | PRN

## 2012-06-17 NOTE — Telephone Encounter (Signed)
Spoke with nurse Misty and advised results rx sent to pharmacy by e-script

## 2012-06-20 ENCOUNTER — Telehealth: Payer: Self-pay | Admitting: *Deleted

## 2012-06-20 NOTE — Telephone Encounter (Signed)
I have him on the metoprolol---not lopressor His blood pressure was good here but if he is having dizziness---okay to cut metoprolol down to 25 mg bid

## 2012-06-20 NOTE — Telephone Encounter (Signed)
Patient was seen today by Marlowe Sh[place] and Jasmine December the nurse is asking if pt's lopressor be stopped or reduced? Pt was put on this in the hospital. Today his BP was 90/52 pulse 50, also pt is suppose to come in on Thursday for PT/INR and nurse was asking if she can draw so the family won't have to bring pt out. Please advise.

## 2012-06-21 NOTE — Telephone Encounter (Signed)
Can nurse draw PT/ INR or should pt still come here?

## 2012-06-22 NOTE — Telephone Encounter (Signed)
[name] [name] with [facility] called back; [name] [name] notified as instructed by telephone. [name] [name] will draw PT/INR on 06/23/12. appt at [facility] coumadin lab on 06/23/12 was cancelled as instructed.

## 2012-06-22 NOTE — Telephone Encounter (Signed)
Sorry Okay for nurse to draw INR for his convenience (though follow up is delayed awaiting the lab report) Cancel appt he has here for INR

## 2012-06-23 ENCOUNTER — Other Ambulatory Visit: Payer: Medicare Other

## 2012-06-24 ENCOUNTER — Encounter: Payer: Self-pay | Admitting: Internal Medicine

## 2012-06-28 ENCOUNTER — Observation Stay: Payer: Self-pay | Admitting: Internal Medicine

## 2012-06-28 LAB — URINALYSIS, COMPLETE
Bacteria: NONE SEEN
Bilirubin,UR: NEGATIVE
Blood: NEGATIVE
Ketone: NEGATIVE
Ph: 7 (ref 4.5–8.0)
Protein: NEGATIVE
RBC,UR: 7 /HPF (ref 0–5)
Specific Gravity: 1.017 (ref 1.003–1.030)
Squamous Epithelial: 2
WBC UR: 2 /HPF (ref 0–5)

## 2012-06-28 LAB — COMPREHENSIVE METABOLIC PANEL
Albumin: 2.3 g/dL — ABNORMAL LOW (ref 3.4–5.0)
Alkaline Phosphatase: 85 U/L (ref 50–136)
Calcium, Total: 7.8 mg/dL — ABNORMAL LOW (ref 8.5–10.1)
Chloride: 104 mmol/L (ref 98–107)
Co2: 29 mmol/L (ref 21–32)
EGFR (African American): >60
Glucose: 131 mg/dL — ABNORMAL HIGH (ref 65–99)
Osmolality: 278 (ref 275–301)
SGOT(AST): 13 U/L — ABNORMAL LOW (ref 15–37)
SGPT (ALT): 16 U/L (ref 12–78)
Sodium: 139 mmol/L (ref 136–145)

## 2012-06-28 LAB — CBC
HGB: 12.2 g/dL — ABNORMAL LOW (ref 13.0–18.0)
MCH: 32.6 pg (ref 26.0–34.0)
MCHC: 33.3 g/dL (ref 32.0–36.0)
MCV: 98 fL (ref 80–100)
RBC: 3.74 10*6/uL — ABNORMAL LOW (ref 4.40–5.90)

## 2012-06-28 LAB — PROTIME-INR
INR: 1
Prothrombin Time: 13.1 secs (ref 11.5–14.7)

## 2012-06-29 DIAGNOSIS — I059 Rheumatic mitral valve disease, unspecified: Secondary | ICD-10-CM

## 2012-06-29 LAB — BASIC METABOLIC PANEL
BUN: 9 mg/dL (ref 7–18)
Calcium, Total: 7.9 mg/dL — ABNORMAL LOW (ref 8.5–10.1)
Creatinine: 0.72 mg/dL (ref 0.60–1.30)
EGFR (Non-African Amer.): >60
Glucose: 83 mg/dL (ref 65–99)
Osmolality: 279 (ref 275–301)
Potassium: 3.8 mmol/L (ref 3.5–5.1)

## 2012-06-29 LAB — CBC WITH DIFFERENTIAL/PLATELET
Basophil %: 0.6 %
Eosinophil %: 1.4 %
HGB: 11.5 g/dL — ABNORMAL LOW (ref 13.0–18.0)
Lymphocyte %: 11.9 %
MCH: 33.4 pg (ref 26.0–34.0)
MCHC: 34.2 g/dL (ref 32.0–36.0)
Monocyte #: 0.5 x10 3/mm (ref 0.2–1.0)
Monocyte %: 6.3 %
Neutrophil #: 6.8 10*3/uL — ABNORMAL HIGH (ref 1.4–6.5)
Neutrophil %: 79.8 %
RBC: 3.45 10*6/uL — ABNORMAL LOW (ref 4.40–5.90)
WBC: 8.5 10*3/uL (ref 3.8–10.6)

## 2012-06-30 ENCOUNTER — Ambulatory Visit: Payer: Medicare Other | Admitting: Internal Medicine

## 2012-06-30 ENCOUNTER — Encounter: Payer: Self-pay | Admitting: Internal Medicine

## 2012-06-30 LAB — PROTIME-INR: INR: 1.1 (ref 0.9–1.1)

## 2012-07-01 ENCOUNTER — Telehealth: Payer: Self-pay

## 2012-07-01 MED ORDER — ATORVASTATIN CALCIUM 10 MG PO TABS: 10.0000 mg | ORAL_TABLET | Freq: Every day | ORAL | Status: DC

## 2012-07-01 NOTE — Telephone Encounter (Signed)
Order faxed and spoke with julie and advised results

## 2012-07-01 NOTE — Telephone Encounter (Signed)
Order written

## 2012-07-01 NOTE — Telephone Encounter (Signed)
Amil Amen from [facility] said pt was on nursing,PT and OT home health orders; pt went to hospital and was discharged 06/30/12. Amil Amen request written order to resume all home health services faxed to [phone] atten;Julia. If faxed this morning home health nursing will go out today.Please advise.

## 2012-07-01 NOTE — Telephone Encounter (Signed)
[name] [name] left v/m requesting less expensive substitute for Crestor (Crestor cost $198.00) to [facility]. Victorino Dike said Crestor was prescribed upon pts d/c from hospital. Victorino Dike request call back.

## 2012-07-01 NOTE — Telephone Encounter (Signed)
Victorino Dike advised.  Rx sent to pharmacy.  Updated EMR.

## 2012-07-01 NOTE — Telephone Encounter (Signed)
Please change to atorvastatin 10mg  daily 1 year Rx

## 2012-07-04 ENCOUNTER — Ambulatory Visit (INDEPENDENT_AMBULATORY_CARE_PROVIDER_SITE_OTHER): Payer: Medicare Other | Admitting: General Practice

## 2012-07-04 DIAGNOSIS — I4891 Unspecified atrial fibrillation: Secondary | ICD-10-CM

## 2012-07-04 LAB — POCT INR: INR: 3.3

## 2012-07-04 NOTE — Patient Instructions (Signed)
Skip coumadin tomorrow (Tuesday) and resume coumadin 5 mg every day.  Re-check on Thursday. Fax orders to [facility]. [phone]

## 2012-07-05 ENCOUNTER — Encounter: Payer: Self-pay | Admitting: Internal Medicine

## 2012-07-05 ENCOUNTER — Ambulatory Visit (INDEPENDENT_AMBULATORY_CARE_PROVIDER_SITE_OTHER): Payer: Medicare Other | Admitting: Internal Medicine

## 2012-07-05 VITALS — BP 128/60 | HR 64 | Temp 97.6°F | Wt 151.0 lb

## 2012-07-05 DIAGNOSIS — J441 Chronic obstructive pulmonary disease with (acute) exacerbation: Secondary | ICD-10-CM

## 2012-07-05 DIAGNOSIS — E785 Hyperlipidemia, unspecified: Secondary | ICD-10-CM | POA: Insufficient documentation

## 2012-07-05 DIAGNOSIS — I4891 Unspecified atrial fibrillation: Secondary | ICD-10-CM

## 2012-07-05 DIAGNOSIS — G459 Transient cerebral ischemic attack, unspecified: Secondary | ICD-10-CM

## 2012-07-05 NOTE — Progress Notes (Signed)
  Subjective:    Patient ID: [name] [name], male    DOB: 12-17-1920, 78 y.o.   MRN: [id number]  HPI Here with daughter and granddaughter  Had left leg weakness which resolved quickly INR was down to 1.0 MRI shows multiple small abnormalities Strength back to normal No swallowing or speech probems No memory or thinking problems  Staying with daughter now in [place]  Has "bad eye" now Burning Now red No cold symptoms  Breathing is okay Remains on oxygen No cough---just clears his throat  90% carotid blockage found Has appt with Dr Wyn Quaker  Current Outpatient Prescriptions on File Prior to Visit  Medication Sig Dispense Refill  . atorvastatin (LIPITOR) 10 MG tablet Take 1 tablet (10 mg total) by mouth daily.  30 tablet  6  . diltiazem (CARDIZEM) 120 MG tablet Take 120 mg by mouth daily.       . furosemide (LASIX) 20 MG tablet TAKE ONE TABLET BY MOUTH ONCE A DAY TO REDUCE SWELLING.  90 tablet  1  . metoprolol (LOPRESSOR) 50 MG tablet Take 50 mg by mouth 2 (two) times daily.       [name] SPIRIVA HANDIHALER 18 MCG inhalation capsule Place 18 mcg into inhaler and inhale daily.       [name] warfarin (COUMADIN) 5 MG tablet Take 1 tablet (5 mg total) by mouth as directed.  30 tablet  11    No Known Allergies  Past Medical History  Diagnosis Date  . Cataract     2004- Cataract removal, 2007 Right eye cataract  . COPD (chronic obstructive pulmonary disease)   . Gastric ulcer     1977- Gastric Ulcers with hemorrhage and surgical repair  . Atrial fibrillation   . Hypertension     Past Surgical History  Procedure Date  . Laceration repair     1983- laceration right wrist     Family History  Problem Relation Age of Onset  . Stroke Mother   . Cancer Neg Hx     History   Social History  . Marital Status: Widowed    Spouse Name: N/A    Number of Children: 3  . Years of Education: N/A   Occupational History  . retired Scientist, product/process development    Social History Main Topics  . Smoking  status: Former Smoker    Quit date: 03/07/2011  . Smokeless tobacco: Never Used  . Alcohol Use: Not on file  . Drug Use: Not on file  . Sexually Active: Not on file   Other Topics Concern  . Not on file   Social History Narrative   Lives with oldest daughterHas DNR--reviewed and he still wants this   Review of Systems Appetite is off--some nausea yesterday Weight down 9#---some diarrhea last week Sleep is not back to normal either     Objective:   Physical Exam  Constitutional: He appears well-developed and well-nourished.  Neck: Normal range of motion. No thyromegaly present.  Cardiovascular: Normal rate and normal heart sounds.  Exam reveals no gallop.   No murmur heard.      irregular  Pulmonary/Chest: Effort normal. No respiratory distress. He has no wheezes. He has no rales.       Slightly decreased breath sounds and widespread rhonchi  Abdominal: Soft. There is no tenderness.  Musculoskeletal: He exhibits no edema.  Lymphadenopathy:    He has no cervical adenopathy.          Assessment & Plan:

## 2012-07-05 NOTE — Assessment & Plan Note (Signed)
Improving Probably can wean the oxygen Improving status

## 2012-07-05 NOTE — Assessment & Plan Note (Signed)
Rate is controlled Back therapeutic on the coumadin

## 2012-07-05 NOTE — Patient Instructions (Signed)
Please set up an appt with the eye doctor as he doesn't seem to have an infection

## 2012-07-05 NOTE — Assessment & Plan Note (Signed)
Left leg weakness transiently High grade right carotid blockage Has appt with vascular surgeon---advised that surgery probably should be done

## 2012-07-06 ENCOUNTER — Ambulatory Visit: Payer: Self-pay | Admitting: Internal Medicine

## 2012-07-06 HISTORY — PX: FRACTURE SURGERY: SHX138

## 2012-07-07 ENCOUNTER — Ambulatory Visit (INDEPENDENT_AMBULATORY_CARE_PROVIDER_SITE_OTHER): Payer: Medicare Other | Admitting: General Practice

## 2012-07-07 DIAGNOSIS — I4891 Unspecified atrial fibrillation: Secondary | ICD-10-CM

## 2012-07-07 NOTE — Patient Instructions (Addendum)
Take 1 tablet every day and re-check in 3 weeks.

## 2012-07-11 DIAGNOSIS — I4891 Unspecified atrial fibrillation: Secondary | ICD-10-CM

## 2012-07-11 DIAGNOSIS — J441 Chronic obstructive pulmonary disease with (acute) exacerbation: Secondary | ICD-10-CM

## 2012-07-11 DIAGNOSIS — J189 Pneumonia, unspecified organism: Secondary | ICD-10-CM

## 2012-07-13 ENCOUNTER — Ambulatory Visit: Payer: Medicare Other | Admitting: Internal Medicine

## 2012-07-21 ENCOUNTER — Ambulatory Visit: Payer: Self-pay | Admitting: Vascular Surgery

## 2012-07-25 ENCOUNTER — Encounter: Payer: Self-pay | Admitting: Internal Medicine

## 2012-07-25 ENCOUNTER — Ambulatory Visit (INDEPENDENT_AMBULATORY_CARE_PROVIDER_SITE_OTHER): Payer: Medicare Other | Admitting: General Practice

## 2012-07-25 ENCOUNTER — Ambulatory Visit (INDEPENDENT_AMBULATORY_CARE_PROVIDER_SITE_OTHER): Payer: Medicare Other | Admitting: Internal Medicine

## 2012-07-25 VITALS — BP 108/60 | HR 81 | Temp 98.6°F

## 2012-07-25 DIAGNOSIS — I4891 Unspecified atrial fibrillation: Secondary | ICD-10-CM

## 2012-07-25 DIAGNOSIS — G459 Transient cerebral ischemic attack, unspecified: Secondary | ICD-10-CM

## 2012-07-25 DIAGNOSIS — J449 Chronic obstructive pulmonary disease, unspecified: Secondary | ICD-10-CM

## 2012-07-25 LAB — POCT INR: INR: 5.4

## 2012-07-25 MED ORDER — FUROSEMIDE 20 MG PO TABS: 20.0000 mg | ORAL_TABLET | Freq: Every day | ORAL | Status: DC

## 2012-07-25 NOTE — Patient Instructions (Signed)
Please check on the cost of xarelto 20mg  daily

## 2012-07-25 NOTE — Assessment & Plan Note (Signed)
Breathing is stable No changes needed 

## 2012-07-25 NOTE — Progress Notes (Signed)
  Subjective:    Patient ID: [name] [name], male    DOB: 1921/03/24, 79 y.o.   MRN: [id number]  HPI "I'm not doing no good.... I just feel bad" No pain except some in left ear---irritation from oxygen tubing Right eye pain also--"like fire burning". Saw eye doctor last week---lens insertion at cataract extraction in different place and reason for pain  No SOB No stroke symptoms like weakness, dysphasia or dysphagia No palpitations No dizziness or syncope  Has a lot of diarrhea Gets gush of watery stool---once a day Gets some urgency but no incontinence  Current Outpatient Prescriptions on File Prior to Visit  Medication Sig Dispense Refill  . atorvastatin (LIPITOR) 10 MG tablet Take 1 tablet (10 mg total) by mouth daily.  30 tablet  6  . diltiazem (CARDIZEM) 120 MG tablet Take 120 mg by mouth daily.       . Fluticasone-Salmeterol (ADVAIR DISKUS) 250-50 MCG/DOSE AEPB Inhale 1 puff into the lungs 2 (two) times daily.      . furosemide (LASIX) 20 MG tablet Take 1 tablet (20 mg total) by mouth daily.  90 tablet  3  . SPIRIVA HANDIHALER 18 MCG inhalation capsule Place 18 mcg into inhaler and inhale daily.       [name] warfarin (COUMADIN) 5 MG tablet Take 1 tablet (5 mg total) by mouth as directed.  30 tablet  11    No Known Allergies  Past Medical History  Diagnosis Date  . Cataract     2004- Cataract removal, 2007 Right eye cataract  . COPD (chronic obstructive pulmonary disease)   . Gastric ulcer     1977- Gastric Ulcers with hemorrhage and surgical repair  . Atrial fibrillation   . Hypertension   . Hyperlipidemia     Past Surgical History  Procedure Date  . Laceration repair     1983- laceration right wrist     Family History  Problem Relation Age of Onset  . Stroke Mother   . Cancer Neg Hx     History   Social History  . Marital Status: Widowed    Spouse Name: N/A    Number of Children: 3  . Years of Education: N/A   Occupational History  . retired Conservator, museum/gallery    Social History Main Topics  . Smoking status: Former Smoker    Quit date: 03/07/2011  . Smokeless tobacco: Never Used  . Alcohol Use: Not on file  . Drug Use: Not on file  . Sexually Active: Not on file   Other Topics Concern  . Not on file   Social History Narrative   Lives with oldest daughterHas DNR--reviewed and he still wants this   Review of Systems Appetite is okay Weight is stable Not sleeping well---tends to just doze    Objective:   Physical Exam  Constitutional: He appears well-developed and well-nourished. No distress.  Neck: Normal range of motion. Neck supple.  Cardiovascular: Normal rate.  Exam reveals no gallop.   No murmur heard.      Slightly irregular  Pulmonary/Chest: Effort normal. No respiratory distress. He has no wheezes. He has no rales.       Decreased breath sounds Some rhonchi Not tight  Musculoskeletal: He exhibits no edema.  Lymphadenopathy:    He has no cervical adenopathy.  Psychiatric:       Sedate Normal affect          Assessment & Plan:

## 2012-07-25 NOTE — Patient Instructions (Addendum)
Do not take coumadin today, Tuesday or Wednesday.  Re-check on  Thursday.

## 2012-07-25 NOTE — Assessment & Plan Note (Signed)
Neuro status is back to baseline Dr Wyn Quaker will be operating on carotid 2/6

## 2012-07-25 NOTE — Assessment & Plan Note (Signed)
INR is elevated again Not sure he is able to manage the coumadin between dosing changes, diet, etc Will try changing to once a day xarelto 20mg  if he can afford the cost

## 2012-07-28 ENCOUNTER — Encounter: Payer: Self-pay | Admitting: *Deleted

## 2012-07-28 ENCOUNTER — Other Ambulatory Visit: Payer: Self-pay | Admitting: General Practice

## 2012-07-28 ENCOUNTER — Ambulatory Visit: Payer: Self-pay | Admitting: Vascular Surgery

## 2012-07-28 ENCOUNTER — Telehealth: Payer: Self-pay | Admitting: General Practice

## 2012-07-28 ENCOUNTER — Ambulatory Visit (INDEPENDENT_AMBULATORY_CARE_PROVIDER_SITE_OTHER): Payer: Medicare Other | Admitting: General Practice

## 2012-07-28 DIAGNOSIS — I4891 Unspecified atrial fibrillation: Secondary | ICD-10-CM

## 2012-07-28 DIAGNOSIS — Z7901 Long term (current) use of anticoagulants: Secondary | ICD-10-CM

## 2012-07-28 DIAGNOSIS — Z5181 Encounter for therapeutic drug level monitoring: Secondary | ICD-10-CM

## 2012-07-28 LAB — CBC
HGB: 12.7 g/dL — ABNORMAL LOW (ref 13.0–18.0)
MCH: 32.7 pg (ref 26.0–34.0)
MCHC: 33.7 g/dL (ref 32.0–36.0)
MCV: 97 fL (ref 80–100)
RDW: 13.1 % (ref 11.5–14.5)
WBC: 8.6 10*3/uL (ref 3.8–10.6)

## 2012-07-28 LAB — BASIC METABOLIC PANEL
Anion Gap: 6 — ABNORMAL LOW (ref 7–16)
BUN: 7 mg/dL (ref 7–18)
Co2: 28 mmol/L (ref 21–32)
Creatinine: 0.72 mg/dL (ref 0.60–1.30)
EGFR (Non-African Amer.): >60
Osmolality: 277 (ref 275–301)
Potassium: 4 mmol/L (ref 3.5–5.1)

## 2012-07-28 LAB — POCT INR: INR: 1.6

## 2012-07-28 MED ORDER — RIVAROXABAN 20 MG PO TABS: 20.0000 mg | ORAL_TABLET | Freq: Every day | ORAL | Status: DC

## 2012-07-28 NOTE — Patient Instructions (Addendum)
Take 7.5 mg today and tomorrow and then change dosage to 5 mg every day except 2.5 mg on Monday and Thursday.  Re-check in 1 week.  Pt may start  Xarelto in the next couple of weeks if Insurance will cover.

## 2012-07-28 NOTE — Telephone Encounter (Signed)
Spoke with Dtr to let her know that Xarelto was called in to pharmacy.  Asked dtr to call me today and let me know if medication is affordable and NOT to take anything until we speak again.

## 2012-07-29 ENCOUNTER — Telehealth: Payer: Self-pay | Admitting: General Practice

## 2012-07-29 NOTE — Telephone Encounter (Signed)
Message copied by Garrison Columbus on Fri Jul 29, 2012  3:11 PM ------      Message from: Tillman Abide I      Created: Thu Jul 28, 2012 11:07 AM       Yes please      20 mg daily      I asked his daughter to check on it --you might want to ask if she did      ----- Message -----         From: Garrison Columbus, RN         Sent: 07/28/2012   9:45 AM           To: Karie Schwalbe, MD            Can we go ahead and call in Xarelto for patient to see what co-pay would be?  Maybe we could start it next week.  Arline Asp

## 2012-08-01 ENCOUNTER — Telehealth: Payer: Self-pay | Admitting: Internal Medicine

## 2012-08-01 ENCOUNTER — Telehealth: Payer: Self-pay | Admitting: *Deleted

## 2012-08-01 NOTE — Telephone Encounter (Signed)
Please see prior auth form on your desk for Xarelto

## 2012-08-01 NOTE — Telephone Encounter (Signed)
Form faxed back.

## 2012-08-01 NOTE — Telephone Encounter (Signed)
[name] [name] preadmission [facility]; Anesthesiologist request pt's last office note.Please advise.

## 2012-08-01 NOTE — Telephone Encounter (Signed)
Form done 

## 2012-08-01 NOTE — Telephone Encounter (Signed)
Office note faxed.

## 2012-08-02 NOTE — Telephone Encounter (Signed)
[facility] called asking about prior auth for Xarelto.  [id number].

## 2012-08-02 NOTE — Telephone Encounter (Signed)
Spoke with pharmacist and advised we had to send the form back again because of missing information.

## 2012-08-03 ENCOUNTER — Other Ambulatory Visit: Payer: Self-pay | Admitting: General Practice

## 2012-08-03 ENCOUNTER — Telehealth: Payer: Self-pay | Admitting: General Practice

## 2012-08-03 NOTE — Telephone Encounter (Signed)
LMOM for patient to call coumadin clinic about status of Xarelto.

## 2012-08-03 NOTE — Telephone Encounter (Signed)
LMOM for [name] [name], patient's dts to call coumadin clinic to let me know if she was able to afford the Xarelto.

## 2012-08-04 ENCOUNTER — Ambulatory Visit (INDEPENDENT_AMBULATORY_CARE_PROVIDER_SITE_OTHER): Payer: Medicare Other | Admitting: General Practice

## 2012-08-04 ENCOUNTER — Telehealth: Payer: Self-pay | Admitting: Internal Medicine

## 2012-08-04 DIAGNOSIS — Z5181 Encounter for therapeutic drug level monitoring: Secondary | ICD-10-CM

## 2012-08-04 DIAGNOSIS — I4891 Unspecified atrial fibrillation: Secondary | ICD-10-CM

## 2012-08-04 DIAGNOSIS — Z7901 Long term (current) use of anticoagulants: Secondary | ICD-10-CM

## 2012-08-04 LAB — POCT INR: INR: 1.7

## 2012-08-04 MED ORDER — ENOXAPARIN SODIUM 100 MG/ML ~~LOC~~ SOLN: 100.0000 mg | Freq: Every day | SUBCUTANEOUS | Status: DC

## 2012-08-04 NOTE — Telephone Encounter (Signed)
Reviewed his protimes Has carotid endarterectomy due 2/5 He will need to be bridged with lovenox Hopefully by the postop period he will be approved to go on xarelto

## 2012-08-04 NOTE — Patient Instructions (Addendum)
Take 5 mg today and 5 mg on Friday.  See pt instructions.  1/131 - Take last dost of coumadin 2/1 - Do not take coumadin and Do not take Lovenox 2/2 - No coumadin - Take Lovenox in AM 2/3 - No coumadin - Take Lovenox in AM 2/4 - No Coumadin - Take Lovenox in AM 2/5 - Surgery (Do not take anything)

## 2012-08-10 ENCOUNTER — Inpatient Hospital Stay: Payer: Self-pay | Admitting: Vascular Surgery

## 2012-08-10 LAB — PROTIME-INR: Prothrombin Time: 13.8 secs (ref 11.5–14.7)

## 2012-08-11 ENCOUNTER — Encounter: Payer: Self-pay | Admitting: Internal Medicine

## 2012-08-11 LAB — CBC WITH DIFFERENTIAL/PLATELET
Eosinophil #: 0 10*3/uL (ref 0.0–0.7)
Eosinophil %: 0.1 %
HCT: 33.3 % — ABNORMAL LOW (ref 40.0–52.0)
HGB: 11.1 g/dL — ABNORMAL LOW (ref 13.0–18.0)
Lymphocyte #: 1.6 10*3/uL (ref 1.0–3.6)
Lymphocyte %: 15.2 %
Monocyte #: 1 x10 3/mm (ref 0.2–1.0)
Neutrophil #: 7.7 10*3/uL — ABNORMAL HIGH (ref 1.4–6.5)
RDW: 13.7 % (ref 11.5–14.5)
WBC: 10.3 10*3/uL (ref 3.8–10.6)

## 2012-08-11 LAB — PROTIME-INR
INR: 1.1
Prothrombin Time: 14.3 secs (ref 11.5–14.7)

## 2012-08-11 LAB — BASIC METABOLIC PANEL
Anion Gap: 6 — ABNORMAL LOW (ref 7–16)
BUN: 7 mg/dL (ref 7–18)
Calcium, Total: 7.9 mg/dL — ABNORMAL LOW (ref 8.5–10.1)
Creatinine: 0.59 mg/dL — ABNORMAL LOW (ref 0.60–1.30)
EGFR (African American): >60
EGFR (Non-African Amer.): >60
Osmolality: 278 (ref 275–301)

## 2012-08-11 LAB — PATHOLOGY REPORT

## 2012-08-13 LAB — PROTIME-INR: INR: 1.5

## 2012-08-15 ENCOUNTER — Other Ambulatory Visit: Payer: Self-pay | Admitting: Internal Medicine

## 2012-08-22 ENCOUNTER — Other Ambulatory Visit: Payer: Self-pay | Admitting: Internal Medicine

## 2012-08-23 ENCOUNTER — Ambulatory Visit (INDEPENDENT_AMBULATORY_CARE_PROVIDER_SITE_OTHER): Payer: Medicare Other | Admitting: Internal Medicine

## 2012-08-23 ENCOUNTER — Other Ambulatory Visit: Payer: Self-pay | Admitting: General Practice

## 2012-08-23 ENCOUNTER — Encounter: Payer: Self-pay | Admitting: Internal Medicine

## 2012-08-23 ENCOUNTER — Ambulatory Visit (INDEPENDENT_AMBULATORY_CARE_PROVIDER_SITE_OTHER)
Admission: RE | Admit: 2012-08-23 | Discharge: 2012-08-23 | Disposition: A | Discharge status: Home / Self Care | Payer: Medicare Other | Source: Ambulatory Visit | Attending: Internal Medicine | Admitting: Internal Medicine

## 2012-08-23 VITALS — BP 110/60 | HR 90 | Temp 98.1°F | Resp 18 | Wt 155.0 lb

## 2012-08-23 DIAGNOSIS — J441 Chronic obstructive pulmonary disease with (acute) exacerbation: Secondary | ICD-10-CM

## 2012-08-23 DIAGNOSIS — I4891 Unspecified atrial fibrillation: Secondary | ICD-10-CM

## 2012-08-23 DIAGNOSIS — J209 Acute bronchitis, unspecified: Secondary | ICD-10-CM | POA: Insufficient documentation

## 2012-08-23 MED ORDER — CEFUROXIME AXETIL 500 MG PO TABS: 500.0000 mg | ORAL_TABLET | Freq: Two times a day (BID) | ORAL | Status: DC

## 2012-08-23 MED ORDER — RIVAROXABAN 20 MG PO TABS: 20.0000 mg | ORAL_TABLET | Freq: Every day | ORAL | Status: DC

## 2012-08-23 MED ORDER — PREDNISONE 20 MG PO TABS: 40.0000 mg | ORAL_TABLET | Freq: Every day | ORAL | Status: DC

## 2012-08-23 NOTE — Assessment & Plan Note (Signed)
xarelto approved Will allow anticoagulation nurse to handle change over

## 2012-08-23 NOTE — Progress Notes (Signed)
Subjective:    Patient ID: [name] [name], male    DOB: [birth year], 79 y.o.   MRN: [id number]  HPI Here with nephew  Surgery went well Now wheezing since the procedure---predated it actually Some SOB Not wearing oxygen right---oximetry fine with oxygen in  Coughing some Generally non productive No fever  No head No head congestion No sore throat  Current Outpatient Prescriptions on File Prior to Visit  Medication Sig Dispense Refill  . ADVAIR DISKUS 250-50 MCG/DOSE AEPB INHALE 1 PUFF BY MOUTH TWICE A DAY  60 each  1  . atorvastatin (LIPITOR) 10 MG tablet Take 1 tablet (10 mg total) by mouth daily.  30 tablet  6  . diltiazem (CARDIZEM) 120 MG tablet TAKE 1 TABLET BY MOUTH EVERY DAY  30 tablet  1  . furosemide (LASIX) 20 MG tablet Take 1 tablet (20 mg total) by mouth daily.  90 tablet  3  . SPIRIVA HANDIHALER 18 MCG inhalation capsule INHALE 1 CAPSULE VIA HANDIHALER ONCE DAILY AT THE SAME TIME EVERY DAY  30 capsule  1  . warfarin (COUMADIN) 5 MG tablet Take 1 tablet (5 mg total) by mouth as directed.  30 tablet  11   No current facility-administered medications on file prior to visit.    No Known Allergies  Past Medical History  Diagnosis Date  . Cataract     2004- Cataract removal, 2007 Right eye cataract  . COPD (chronic obstructive pulmonary disease)   . Gastric ulcer     1977- Gastric Ulcers with hemorrhage and surgical repair  . Atrial fibrillation   . Hypertension   . Hyperlipidemia     Past Surgical History  Procedure Laterality Date  . Laceration repair      1983- laceration right wrist   . Carotid endarterectomy  214    right===Dr Dew    Family History  Problem Relation Age of Onset  . Stroke Mother   . Cancer Neg Hx     History   Social History  . Marital Status: Widowed    Spouse Name: N/A    Number of Children: 3  . Years of Education: N/A   Occupational History  . retired Scientist, product/process development    Social History Main Topics  . Smoking status:  Former Smoker    Quit date: 03/07/2011  . Smokeless tobacco: Never Used  . Alcohol Use: Not on file  . Drug Use: Not on file  . Sexually Active: Not on file   Other Topics Concern  . Not on file   Social History Narrative   Lives with oldest daughter   Has DNR--reviewed and he still wants this         Review of Systems No vomiting or diarrhea. Stool has not been solid since the surgery Appetite is not big---no real change     Objective:   Physical Exam  Constitutional: He appears well-developed and well-nourished. No distress.  HENT:  Mouth/Throat: Oropharynx is clear and moist. No oropharyngeal exudate.  Moderate nasal inflammation TMs normal  Neck: Normal range of motion. Neck supple.  Right CEA scar clean and dry  Cardiovascular: Normal rate and normal heart sounds.  Exam reveals no gallop.   No murmur heard. irregular  Pulmonary/Chest: Effort normal. No respiratory distress. He has wheezes. He has no rales.  Decreased breath sounds Mild prolonged exp phase and exp wheezing  Musculoskeletal: He exhibits no edema.  Lymphadenopathy:    He has no cervical adenopathy.  Assessment & Plan:

## 2012-08-23 NOTE — Assessment & Plan Note (Signed)
Mild worsening Needs to keep oxygen on right Will give prednisone course for 2 weeks Same other meds

## 2012-08-23 NOTE — Assessment & Plan Note (Signed)
No distress now PE findings don't indicate pneumonia Has left pleural effusion and persistent right atelectasis Will cover for pulmonary infection

## 2012-08-23 NOTE — Patient Instructions (Addendum)
Please start the prednisone today---that will help your breathing The cefuroxime is for possible infection Call if your breathing is not improving in the next few days-- or if you get worse I will call your daughter with the official x-ray report

## 2012-08-25 ENCOUNTER — Telehealth: Payer: Self-pay | Admitting: General Practice

## 2012-08-25 NOTE — Telephone Encounter (Signed)
LMOM:  Following up in regards to changing patient over to Xarelto.  Asked patient's dtr to call coumadin clinic for possible appointment on Monday 2/23 at [facility] office.

## 2012-08-26 ENCOUNTER — Telehealth: Payer: Self-pay | Admitting: General Practice

## 2012-08-26 NOTE — Telephone Encounter (Signed)
LMOM for pt to call coumadin clinic in regards to starting Xarelto.

## 2012-08-26 NOTE — Telephone Encounter (Signed)
Pt's dtr left message stating that pt will be able to afford Xarelto and does want to make the change from coumadin.

## 2012-09-01 ENCOUNTER — Ambulatory Visit (INDEPENDENT_AMBULATORY_CARE_PROVIDER_SITE_OTHER): Payer: Medicare Other | Admitting: General Practice

## 2012-09-01 NOTE — Patient Instructions (Signed)
Stop taking coumadin and start Xarelto on Saturday.

## 2012-09-02 ENCOUNTER — Telehealth: Payer: Self-pay | Admitting: Internal Medicine

## 2012-09-02 NOTE — Telephone Encounter (Signed)
Forest Becker gave forms to [facility] and advise me to disregard this message

## 2012-09-02 NOTE — Telephone Encounter (Signed)
Mr. Stradling dropped off a placard form to be filled out by Dr. Milinda Antis.

## 2012-10-10 ENCOUNTER — Other Ambulatory Visit: Payer: Self-pay | Admitting: Internal Medicine

## 2012-10-16 ENCOUNTER — Other Ambulatory Visit: Payer: Self-pay | Admitting: Internal Medicine

## 2012-12-13 ENCOUNTER — Other Ambulatory Visit: Payer: Self-pay | Admitting: Internal Medicine

## 2012-12-25 ENCOUNTER — Other Ambulatory Visit: Payer: Self-pay | Admitting: Internal Medicine

## 2012-12-28 ENCOUNTER — Other Ambulatory Visit: Payer: Self-pay | Admitting: Internal Medicine

## 2013-01-25 ENCOUNTER — Inpatient Hospital Stay: Payer: Self-pay | Admitting: Orthopedic Surgery

## 2013-01-25 LAB — CBC
MCV: 93 fL (ref 80–100)
Platelet: 272 10*3/uL (ref 150–440)
RDW: 13.2 % (ref 11.5–14.5)
WBC: 18.8 10*3/uL — ABNORMAL HIGH (ref 3.8–10.6)

## 2013-01-25 LAB — BASIC METABOLIC PANEL
Anion Gap: 5 — ABNORMAL LOW (ref 7–16)
BUN: 11 mg/dL (ref 7–18)
Calcium, Total: 8.7 mg/dL (ref 8.5–10.1)
Chloride: 103 mmol/L (ref 98–107)
Creatinine: 0.85 mg/dL (ref 0.60–1.30)
EGFR (African American): >60
Glucose: 109 mg/dL — ABNORMAL HIGH (ref 65–99)
Osmolality: 272 (ref 275–301)
Sodium: 136 mmol/L (ref 136–145)

## 2013-01-25 LAB — APTT: Activated PTT: 33.7 secs (ref 23.6–35.9)

## 2013-01-26 LAB — URINALYSIS, COMPLETE
Bilirubin,UR: NEGATIVE
Glucose,UR: NEGATIVE mg/dL (ref 0–75)
Leukocyte Esterase: NEGATIVE
Nitrite: NEGATIVE
Protein: 30
RBC,UR: 19 /HPF (ref 0–5)
Squamous Epithelial: <1
WBC UR: 2 /HPF (ref 0–5)

## 2013-01-26 LAB — CBC WITH DIFFERENTIAL/PLATELET
Basophil #: 0 10*3/uL (ref 0.0–0.1)
HCT: 36.6 % — ABNORMAL LOW (ref 40.0–52.0)
HGB: 12.6 g/dL — ABNORMAL LOW (ref 13.0–18.0)
Lymphocyte %: 6.3 %
MCH: 32 pg (ref 26.0–34.0)
MCV: 93 fL (ref 80–100)
Monocyte #: 0.6 x10 3/mm (ref 0.2–1.0)
Monocyte %: 3.7 %
Neutrophil %: 89.9 %
Platelet: 233 10*3/uL (ref 150–440)
RBC: 3.95 10*6/uL — ABNORMAL LOW (ref 4.40–5.90)

## 2013-01-26 LAB — BASIC METABOLIC PANEL
BUN: 12 mg/dL (ref 7–18)
Calcium, Total: 8.4 mg/dL — ABNORMAL LOW (ref 8.5–10.1)
Co2: 27 mmol/L (ref 21–32)
EGFR (African American): >60
Glucose: 108 mg/dL — ABNORMAL HIGH (ref 65–99)
Sodium: 136 mmol/L (ref 136–145)

## 2013-01-27 LAB — CBC WITH DIFFERENTIAL/PLATELET
Basophil #: 0 10*3/uL (ref 0.0–0.1)
Eosinophil #: 0 10*3/uL (ref 0.0–0.7)
Eosinophil %: 0.3 %
HCT: 36.9 % — ABNORMAL LOW (ref 40.0–52.0)
HGB: 12.5 g/dL — ABNORMAL LOW (ref 13.0–18.0)
Lymphocyte #: 0.6 10*3/uL — ABNORMAL LOW (ref 1.0–3.6)
Monocyte #: 0.6 x10 3/mm (ref 0.2–1.0)
Neutrophil #: 15.8 10*3/uL — ABNORMAL HIGH (ref 1.4–6.5)
Platelet: 191 10*3/uL (ref 150–440)
RBC: 3.93 10*6/uL — ABNORMAL LOW (ref 4.40–5.90)
RDW: 13.4 % (ref 11.5–14.5)
WBC: 17.1 10*3/uL — ABNORMAL HIGH (ref 3.8–10.6)

## 2013-01-27 LAB — BASIC METABOLIC PANEL
Anion Gap: 5 — ABNORMAL LOW (ref 7–16)
Calcium, Total: 8.2 mg/dL — ABNORMAL LOW (ref 8.5–10.1)
Chloride: 101 mmol/L (ref 98–107)
Co2: 28 mmol/L (ref 21–32)
Creatinine: 0.82 mg/dL (ref 0.60–1.30)
EGFR (African American): >60
EGFR (Non-African Amer.): >60
Potassium: 4.4 mmol/L (ref 3.5–5.1)
Sodium: 134 mmol/L — ABNORMAL LOW (ref 136–145)

## 2013-01-28 LAB — CBC WITH DIFFERENTIAL/PLATELET
Basophil #: 0 10*3/uL (ref 0.0–0.1)
Eosinophil #: 0.2 10*3/uL (ref 0.0–0.7)
Eosinophil %: 1.8 %
HCT: 32.4 % — ABNORMAL LOW (ref 40.0–52.0)
HGB: 11.1 g/dL — ABNORMAL LOW (ref 13.0–18.0)
Lymphocyte #: 0.8 10*3/uL — ABNORMAL LOW (ref 1.0–3.6)
MCH: 32 pg (ref 26.0–34.0)
MCHC: 34.3 g/dL (ref 32.0–36.0)
MCV: 93 fL (ref 80–100)
Monocyte #: 0.7 x10 3/mm (ref 0.2–1.0)
Monocyte %: 5.6 %
Neutrophil %: 86.3 %
RBC: 3.48 10*6/uL — ABNORMAL LOW (ref 4.40–5.90)

## 2013-01-28 LAB — PROTIME-INR
INR: 1.1
Prothrombin Time: 14.5 secs (ref 11.5–14.7)

## 2013-01-28 LAB — BASIC METABOLIC PANEL
Anion Gap: 1 — ABNORMAL LOW (ref 7–16)
Calcium, Total: 8.4 mg/dL — ABNORMAL LOW (ref 8.5–10.1)
Co2: 34 mmol/L — ABNORMAL HIGH (ref 21–32)
Creatinine: 0.81 mg/dL (ref 0.60–1.30)
EGFR (African American): >60
Glucose: 108 mg/dL — ABNORMAL HIGH (ref 65–99)
Osmolality: 275 (ref 275–301)
Potassium: 4 mmol/L (ref 3.5–5.1)

## 2013-01-29 LAB — CBC WITH DIFFERENTIAL/PLATELET
Basophil #: 0 10*3/uL (ref 0.0–0.1)
Eosinophil #: 0.5 10*3/uL (ref 0.0–0.7)
Eosinophil %: 4.6 %
HCT: 30.3 % — ABNORMAL LOW (ref 40.0–52.0)
HGB: 10.1 g/dL — ABNORMAL LOW (ref 13.0–18.0)
Lymphocyte %: 9.4 %
MCH: 32.1 pg (ref 26.0–34.0)
MCH: 32.2 pg (ref 26.0–34.0)
MCHC: 34.5 g/dL (ref 32.0–36.0)
MCHC: 34.5 g/dL (ref 32.0–36.0)
MCV: 93 fL (ref 80–100)
Monocyte %: 6.3 %
Monocyte %: 5.6 %
Neutrophil #: 7.8 10*3/uL — ABNORMAL HIGH (ref 1.4–6.5)
Neutrophil #: 9.1 10*3/uL — ABNORMAL HIGH (ref 1.4–6.5)
Neutrophil %: 78.8 %
Platelet: 168 10*3/uL (ref 150–440)
RBC: 3.25 10*6/uL — ABNORMAL LOW (ref 4.40–5.90)
RBC: 3.13 10*6/uL — ABNORMAL LOW (ref 4.40–5.90)
RDW: 12.9 % (ref 11.5–14.5)
WBC: 11.2 10*3/uL — ABNORMAL HIGH (ref 3.8–10.6)

## 2013-01-29 LAB — BASIC METABOLIC PANEL
Anion Gap: 3 — ABNORMAL LOW (ref 7–16)
BUN: 11 mg/dL (ref 7–18)
Calcium, Total: 8.2 mg/dL — ABNORMAL LOW (ref 8.5–10.1)
Creatinine: 0.7 mg/dL (ref 0.60–1.30)
EGFR (Non-African Amer.): >60
Osmolality: 275 (ref 275–301)
Potassium: 3.8 mmol/L (ref 3.5–5.1)

## 2013-01-29 LAB — PROTIME-INR
INR: 1.1
Prothrombin Time: 14 secs (ref 11.5–14.7)

## 2013-01-30 LAB — CBC WITH DIFFERENTIAL/PLATELET
Basophil %: 0.3 %
Eosinophil #: 0.2 10*3/uL (ref 0.0–0.7)
Eosinophil %: 1.6 %
HCT: 30.6 % — ABNORMAL LOW (ref 40.0–52.0)
HGB: 10.3 g/dL — ABNORMAL LOW (ref 13.0–18.0)
Lymphocyte %: 7.3 %
MCHC: 33.5 g/dL (ref 32.0–36.0)
MCV: 94 fL (ref 80–100)
Monocyte #: 0.8 x10 3/mm (ref 0.2–1.0)
Monocyte %: 7 %
Platelet: 199 10*3/uL (ref 150–440)
RDW: 13.1 % (ref 11.5–14.5)
WBC: 12.2 10*3/uL — ABNORMAL HIGH (ref 3.8–10.6)

## 2013-01-30 LAB — BASIC METABOLIC PANEL
Anion Gap: 3 — ABNORMAL LOW (ref 7–16)
BUN: 14 mg/dL (ref 7–18)
Co2: 31 mmol/L (ref 21–32)
Osmolality: 277 (ref 275–301)

## 2013-02-01 LAB — CBC WITH DIFFERENTIAL/PLATELET
Eosinophil #: 0.2 10*3/uL (ref 0.0–0.7)
HCT: 26.7 % — ABNORMAL LOW (ref 40.0–52.0)
HGB: 9.4 g/dL — ABNORMAL LOW (ref 13.0–18.0)
Lymphocyte #: 1.1 10*3/uL (ref 1.0–3.6)
Lymphocyte %: 9.2 %
MCH: 32.9 pg (ref 26.0–34.0)
MCHC: 35.1 g/dL (ref 32.0–36.0)
Monocyte #: 0.8 x10 3/mm (ref 0.2–1.0)
Neutrophil #: 9.5 10*3/uL — ABNORMAL HIGH (ref 1.4–6.5)
Neutrophil %: 81.3 %
RBC: 2.84 10*6/uL — ABNORMAL LOW (ref 4.40–5.90)
RDW: 13 % (ref 11.5–14.5)
WBC: 11.7 10*3/uL — ABNORMAL HIGH (ref 3.8–10.6)

## 2013-02-01 LAB — PATHOLOGY REPORT

## 2013-02-02 ENCOUNTER — Non-Acute Institutional Stay (SKILLED_NURSING_FACILITY): Payer: Medicare Other | Admitting: Adult Health

## 2013-02-02 ENCOUNTER — Encounter: Payer: Self-pay | Admitting: Adult Health

## 2013-02-02 DIAGNOSIS — S72009S Fracture of unspecified part of neck of unspecified femur, sequela: Secondary | ICD-10-CM

## 2013-02-02 DIAGNOSIS — J449 Chronic obstructive pulmonary disease, unspecified: Secondary | ICD-10-CM

## 2013-02-02 DIAGNOSIS — S72001A Fracture of unspecified part of neck of right femur, initial encounter for closed fracture: Secondary | ICD-10-CM | POA: Insufficient documentation

## 2013-02-02 DIAGNOSIS — S72001S Fracture of unspecified part of neck of right femur, sequela: Secondary | ICD-10-CM

## 2013-02-02 DIAGNOSIS — R609 Edema, unspecified: Secondary | ICD-10-CM

## 2013-02-02 DIAGNOSIS — I10 Essential (primary) hypertension: Secondary | ICD-10-CM

## 2013-02-02 DIAGNOSIS — R3911 Hesitancy of micturition: Secondary | ICD-10-CM | POA: Insufficient documentation

## 2013-02-02 DIAGNOSIS — J4489 Other specified chronic obstructive pulmonary disease: Secondary | ICD-10-CM

## 2013-02-02 DIAGNOSIS — K5900 Constipation, unspecified: Secondary | ICD-10-CM

## 2013-02-02 DIAGNOSIS — M810 Age-related osteoporosis without current pathological fracture: Secondary | ICD-10-CM

## 2013-02-02 DIAGNOSIS — K219 Gastro-esophageal reflux disease without esophagitis: Secondary | ICD-10-CM

## 2013-02-02 DIAGNOSIS — I4891 Unspecified atrial fibrillation: Secondary | ICD-10-CM | POA: Insufficient documentation

## 2013-02-02 MED ORDER — CALCIUM CARBONATE-VITAMIN D 500-200 MG-UNIT PO TABS: 1.0000 | ORAL_TABLET | Freq: Two times a day (BID) | ORAL | Status: DC

## 2013-02-02 MED ORDER — VITAMIN D 50 MCG (2000 UT) PO CAPS: 2000.0000 [IU] | ORAL_CAPSULE | Freq: Every day | ORAL | Status: DC

## 2013-02-02 MED ORDER — ALENDRONATE SODIUM 70 MG PO TABS: 70.0000 mg | ORAL_TABLET | ORAL | Status: DC

## 2013-02-02 NOTE — Assessment & Plan Note (Signed)
He is stable is being seen by therapy as directed; will continue oxycodone 5 mg every 6 hours as needed for pain and will monitor his status

## 2013-02-02 NOTE — Assessment & Plan Note (Signed)
Will continue surfac 240 mg daily

## 2013-02-02 NOTE — Assessment & Plan Note (Addendum)
He is stable will continue spiriva daily advair 250/50 twice daily and atrovent nasal spray twice daily; duoneb every 6 hours as needed  and will monitor his status

## 2013-02-02 NOTE — Assessment & Plan Note (Signed)
He had a foley placed during his hospitalization; he is presently on bethanechol 25 mg three times daily will stop this medication.

## 2013-02-02 NOTE — Assessment & Plan Note (Signed)
He is stable will continue cardizem cd 120 mg daily

## 2013-02-02 NOTE — Progress Notes (Signed)
Patient ID: [name] [name], male   DOB: 1921-03-26, 79 y.o.   MRN: [id number]  [facility]  No Known Allergies   Chief Complaint  Patient presents with  . Hospitalization Follow-up    HPI: He has been hospitalized post fall in which he suffered a right hip fracture. He had a right hip hemiarthroplasty. He is here for short term rehab and will return home with his granddaughter.   Past Medical History  Diagnosis Date  . Cataract     2004- Cataract removal, 2007 Right eye cataract  . COPD (chronic obstructive pulmonary disease)   . Gastric ulcer     1977- Gastric Ulcers with hemorrhage and surgical repair  . Atrial fibrillation   . Hypertension   . Hyperlipidemia   . Stroke   . Pneumonia   . Fracture of femoral neck, right     Past Surgical History  Procedure Laterality Date  . Laceration repair      1983- laceration right wrist   . Carotid endarterectomy  214    right===Dr Dew  . Right hip hemiarthroplasty      VITAL SIGNS BP 143/72  Pulse 100  Ht 5\' 11"  (1.803 m)  Wt 160 lb (72.576 kg)  BMI 22.33 kg/m2   Patient's Medications  New Prescriptions   No medications on file  Previous Medications   ACETAMINOPHEN (TYLENOL) 325 MG TABLET    Take 650 mg by mouth every 6 (six) hours.   ADVAIR DISKUS 250-50 MCG/DOSE AEPB    INHALE 1 PUFF BY MOUTH TWICE A DAY   ASPIRIN 81 MG TABLET    Take 81 mg by mouth daily.   BETHANECHOL (URECHOLINE) 25 MG TABLET    Take 25 mg by mouth 3 (three) times daily.   CALCIUM-VITAMIN D (OSCAL WITH D) 500-200 MG-UNIT PER TABLET    Take 1 tablet by mouth 2 (two) times daily.   DILTIAZEM (CARDIZEM) 120 MG TABLET    TAKE 1 TABLET BY MOUTH EVERY DAY   DOCUSATE CALCIUM (SURFAK) 240 MG CAPSULE    Take 240 mg by mouth at bedtime.   FERROUS SULFATE 325 (65 FE) MG TABLET    Take 325 mg by mouth 2 (two) times daily with a meal.   FUROSEMIDE (LASIX) 20 MG TABLET    Take 1 tablet (20 mg total) by mouth daily.   IPRATROPIUM (ATROVENT) 0.03 % NASAL SPRAY     Place 2 sprays into the nose 2 (two) times daily.   IPRATROPIUM-ALBUTEROL (DUONEB) 0.5-2.5 (3) MG/3ML SOLN    Take 3 mLs by nebulization every 6 (six) hours as needed.   LEVOFLOXACIN (LEVAQUIN) 500 MG TABLET    Take 500 mg by mouth daily.   OXYCODONE (OXY IR/ROXICODONE) 5 MG IMMEDIATE RELEASE TABLET    Take 5 mg by mouth every 6 (six) hours as needed for pain.   SPIRIVA HANDIHALER 18 MCG INHALATION CAPSULE    INHALE 1 CAPSULE VIA HANDIHALER ONCE DAILY AT THE SAME TIME EVERY DAY   XARELTO 20 MG TABS    TAKE 1 TABLET BY MOUTH EVERY DAY WITH SUPPER  Modified Medications   No medications on file  Discontinued Medications   ATORVASTATIN (LIPITOR) 10 MG TABLET    Take 1 tablet (10 mg total) by mouth daily.   CEFUROXIME (CEFTIN) 500 MG TABLET    Take 1 tablet (500 mg total) by mouth 2 (two) times daily.   PREDNISONE (DELTASONE) 20 MG TABLET    Take 2 tablets (40 mg total)  by mouth daily.   XARELTO 20 MG TABS    TAKE 1 TABLET BY MOUTH EVERY DAY WITH SUPPER    SIGNIFICANT DIAGNOSTIC EXAMS  01-25-13: right femur x-ray: an acute subcapital fracture of right hip. Mild angulation at the fracture site.  01-25-13: chest x-ray: increased Insterstitial markings are present within both lungs. This suggests subsegmental atelectasis or interstitial pneumonia.  01-25-13: right elbow x-ray: on acute bony abnormality present.  01-25-13: right hip x-ray: no acute bony abnormality of pelvis 01-26-13: chest x-ray: 1. Small effusion versus chronic scarring of the left lung. 2. interstitial infiltrate.  01-29-13: right hip x-ray; status post total right hip replacement    LABS REVIEWED:   01-25-13: wbc 18.8; hgb 14.0; hct 41.4; mcv 93 ;plt 272 02-01-13: wbc 11.7; hgb 9.4; hct 26.7; mcv 94; plt 173; glucose 113; bun 14; creat0.79; k+4.0 Na++138   Review of Systems  Constitutional: Negative for malaise/fatigue.  Respiratory: Negative for cough and shortness of breath.   Cardiovascular: Negative for chest pain,  palpitations and leg swelling.  Gastrointestinal: Negative for heartburn, abdominal pain and constipation.  Musculoskeletal: Negative for myalgias and joint pain.  Skin: Negative.   Neurological: Negative for headaches.  Psychiatric/Behavioral: The patient is not nervous/anxious and does not have insomnia.     Physical Exam  Constitutional: He appears well-developed and well-nourished.  Neck: Neck supple. No JVD present. No thyromegaly present.  Cardiovascular: Normal rate, regular rhythm and intact distal pulses.   Respiratory: Effort normal and breath sounds normal. No respiratory distress. He has no wheezes.  O2 dependent   GI: Soft. Bowel sounds are normal. He exhibits no distension. There is no tenderness.  Musculoskeletal:  Is able to move all extremities; has trace pedal edema  Neurological: He is alert.  Skin: Skin is warm and dry.  Incision line without signs of infection or inflammation present; has some swelling and bruising present.        ASSESSMENT/ PLAN:  COPD He is stable will continue spiriva daily advair 250/50 twice daily and atrovent nasal spray twice daily; duoneb every 6 hours as needed  and will monitor his status   Hypertension He is stable will continue cardizem cd 120 mg daily   GERD (gastroesophageal reflux disease) Will continue bethanechol 25 mg three times daily   A-fib Heart rate is stable will continue xarelto 20 mg daily asa 81; mg daily  and will continue cardizem cd 120 mg daily for rate control and will monitor  Unspecified constipation Will continue surfac 240 mg daily   Edema Stable will continue lasix 20 mg daily and will monitor   Fracture of femoral neck, right He is stable is being seen by therapy as directed; will continue oxycodone 5 mg every 6 hours as needed for pain and will monitor his status   Urinary hesitancy He had a foley placed during his hospitalization; he is presently on bethanechol 25 mg three times daily will  stop this medication.   Osteoporosis, unspecified He is status post hip fracture. Will begin fosamax 70 mg weekly; will change his ca++ to 500 mg twice daily and vit d 200 units daily and will monitor        Time spent with patient 50 minutes

## 2013-02-02 NOTE — Assessment & Plan Note (Signed)
Stable will continue lasix 20 mg daily and will monitor

## 2013-02-02 NOTE — Assessment & Plan Note (Addendum)
Heart rate is stable will continue xarelto 20 mg daily asa 81; mg daily  and will continue cardizem cd 120 mg daily for rate control and will monitor

## 2013-02-02 NOTE — Assessment & Plan Note (Signed)
Will continue bethanechol 25 mg three times daily

## 2013-02-02 NOTE — Assessment & Plan Note (Signed)
He is status post hip fracture. Will begin fosamax 70 mg weekly; will change his ca++ to 500 mg twice daily and vit d 200 units daily and will monitor

## 2013-02-06 ENCOUNTER — Non-Acute Institutional Stay (SKILLED_NURSING_FACILITY): Payer: Medicare Other | Admitting: Internal Medicine

## 2013-02-06 DIAGNOSIS — M810 Age-related osteoporosis without current pathological fracture: Secondary | ICD-10-CM

## 2013-02-06 DIAGNOSIS — R609 Edema, unspecified: Secondary | ICD-10-CM

## 2013-02-06 DIAGNOSIS — J4489 Other specified chronic obstructive pulmonary disease: Secondary | ICD-10-CM

## 2013-02-06 DIAGNOSIS — I4891 Unspecified atrial fibrillation: Secondary | ICD-10-CM

## 2013-02-06 DIAGNOSIS — D649 Anemia, unspecified: Secondary | ICD-10-CM

## 2013-02-06 DIAGNOSIS — I10 Essential (primary) hypertension: Secondary | ICD-10-CM

## 2013-02-06 DIAGNOSIS — S72001S Fracture of unspecified part of neck of right femur, sequela: Secondary | ICD-10-CM

## 2013-02-06 DIAGNOSIS — S72009S Fracture of unspecified part of neck of unspecified femur, sequela: Secondary | ICD-10-CM

## 2013-02-06 DIAGNOSIS — J449 Chronic obstructive pulmonary disease, unspecified: Secondary | ICD-10-CM

## 2013-02-06 NOTE — Progress Notes (Signed)
Patient ID: [name] [name], male   DOB: 05-Dec-1920, 79 y.o.   MRN: [id number]  [facility]  PCP: Tillman Abide, MD  Code Status: DNR  No Known Allergies  Chief Complaint- new admit post hospitalization  HPI:  79 y/o male patient is here for STR post hospital admission with right hip fracture and undergoing right hip arthroplasty. He tolerated the procedure well. He was seen in his room today. He denies any complaints. His pain is under control he has been working with therapy team. No concerns from staff  Review of Systems:  Constitutional: Negative for malaise/fatigue.  Respiratory: Negative for cough and shortness of breath.   Cardiovascular: Negative for chest pain, palpitations and leg swelling.  Gastrointestinal: Negative for heartburn, abdominal pain and constipation.  Musculoskeletal: Negative for myalgias and joint pain.  Skin: Negative.   Neurological: Negative for headaches.  Psychiatric/Behavioral: The patient is not nervous/anxious and does not have insomnia   Past Medical History  Diagnosis Date  . Cataract     2004- Cataract removal, 2007 Right eye cataract  . COPD (chronic obstructive pulmonary disease)   . Gastric ulcer     1977- Gastric Ulcers with hemorrhage and surgical repair  . Atrial fibrillation   . Hypertension   . Hyperlipidemia   . Stroke   . Pneumonia   . Fracture of femoral neck, right    Past Surgical History  Procedure Laterality Date  . Laceration repair      1983- laceration right wrist   . Carotid endarterectomy  214    right===Dr Dew  . Right hip hemiarthroplasty     Social History:   reports that he quit smoking about 23 months ago. He has never used smokeless tobacco. His alcohol and drug histories are not on file.  Family History  Problem Relation Age of Onset  . Stroke Mother   . Cancer Neg Hx     Medications: Patient's Medications  New Prescriptions   No medications on file  Previous Medications   ACETAMINOPHEN (TYLENOL) 325 MG TABLET    Take 650 mg by mouth every 6 (six) hours.   ADVAIR DISKUS 250-50 MCG/DOSE AEPB    INHALE 1 PUFF BY MOUTH TWICE A DAY   ALENDRONATE (FOSAMAX) 70 MG TABLET    Take 1 tablet (70 mg total) by mouth every 7 (seven) days. Take with a full glass of water on an empty stomach.   ASPIRIN 81 MG TABLET    Take 81 mg by mouth daily.   CALCIUM-VITAMIN D (OSCAL WITH D) 500-200 MG-UNIT PER TABLET    Take 1 tablet by mouth 2 (two) times daily.   CHOLECALCIFEROL (VITAMIN D) 2000 UNITS CAPS    Take 1 capsule (2,000 Units total) by mouth daily.   DILTIAZEM (CARDIZEM) 120 MG TABLET    TAKE 1 TABLET BY MOUTH EVERY DAY   DOCUSATE CALCIUM (SURFAK) 240 MG CAPSULE    Take 240 mg by mouth at bedtime.   FERROUS SULFATE 325 (65 FE) MG TABLET    Take 325 mg by mouth 2 (two) times daily with a meal.   FUROSEMIDE (LASIX) 20 MG TABLET    Take 1 tablet (20 mg total) by mouth daily.   IPRATROPIUM (ATROVENT) 0.03 % NASAL SPRAY    Place 2 sprays into the nose 2 (two) times daily.   IPRATROPIUM-ALBUTEROL (DUONEB) 0.5-2.5 (3) MG/3ML SOLN    Take 3 mLs by nebulization every 6 (six) hours as needed.   LEVOFLOXACIN (LEVAQUIN) 500  MG TABLET    Take 500 mg by mouth daily.   OXYCODONE (OXY IR/ROXICODONE) 5 MG IMMEDIATE RELEASE TABLET    Take 5 mg by mouth every 6 (six) hours as needed for pain.   SPIRIVA HANDIHALER 18 MCG INHALATION CAPSULE    INHALE 1 CAPSULE VIA HANDIHALER ONCE DAILY AT THE SAME TIME EVERY DAY   XARELTO 20 MG TABS    TAKE 1 TABLET BY MOUTH EVERY DAY WITH SUPPER  Modified Medications   No medications on file  Discontinued Medications   No medications on file     Physical Exam:  Filed Vitals:   02/06/13 1702  BP: 124/64  Pulse: 76  Temp: 98.3 F (36.8 C)  Resp: 18  SpO2: 99%    Constitutional: He appears well-developed and well-nourished.  Neck: Neck supple. No JVD present. No thyromegaly present.  Cardiovascular: Normal rate, regular rhythm and intact distal pulses.    Respiratory: Effort normal and breath sounds normal. No respiratory distress. He has no wheezes.  O2 dependent  and on nasal canula GI: Soft. Bowel sounds are normal. He exhibits no distension. There is no tenderness.  Musculoskeletal:  Is able to move all extremities; has trace pedal edema  Neurological: He is alert.  Skin: Skin is warm and dry. Has ecchymoses in right thigh area. Incision line has staples in place and is clean and dry    Labs- 02/02/13 wbc 10.5, hb 9.2, hct 28.8, plt 295  Assessment/Plan  Fracture of femoral neck, right S/p right hip arthroplasty. Has been working with therapy team. Continue oxycodone 5 mg every 6 hours as needed for pain and monitor his status. Continue xarelto for dvt prophylaxis. Continue stool softener  Osteoporosis, unspecified recent hip fracture. Continue fosamax 70 mg weekly and ca-vit d  COPD cotninue o2, advair, duonebs and atrovent nasal spray. Breathing appears stable. Monitor o2 sat  Hypertension Stable. continue cardizem cd 120 mg daily and asa 81 mg daily  A-fib Rate under control with diltiazem and continue aspirin. Also on xarelto for now  Anemia Low hb/hct. He is on asa and xarelto and has had recent surgery. Recheck cbc in a week and monitor for signs of bleed  Edema Stable with lasix 20 mg daily, monitor. Check bmp   Labs/tests ordered- cbc, bmp in 1 week

## 2013-02-09 ENCOUNTER — Non-Acute Institutional Stay (SKILLED_NURSING_FACILITY): Payer: Medicare Other | Admitting: Adult Health

## 2013-02-09 DIAGNOSIS — R609 Edema, unspecified: Secondary | ICD-10-CM

## 2013-02-13 ENCOUNTER — Other Ambulatory Visit: Payer: Self-pay | Admitting: Internal Medicine

## 2013-03-02 ENCOUNTER — Telehealth: Payer: Self-pay | Admitting: *Deleted

## 2013-03-02 NOTE — Telephone Encounter (Signed)
OT therapist calling asking for verbal order to continue therapy for 2 times a week for 3 weeks and 1 time a week for 2 weeks. Please advise

## 2013-03-02 NOTE — Telephone Encounter (Signed)
Left message on VM with results, per request from OT therapist

## 2013-03-02 NOTE — Telephone Encounter (Signed)
That is okay.

## 2013-03-03 ENCOUNTER — Ambulatory Visit (INDEPENDENT_AMBULATORY_CARE_PROVIDER_SITE_OTHER): Payer: Medicare Other | Admitting: Internal Medicine

## 2013-03-03 ENCOUNTER — Encounter: Payer: Self-pay | Admitting: Internal Medicine

## 2013-03-03 VITALS — BP 120/60 | HR 98 | Temp 97.5°F | Wt 165.0 lb

## 2013-03-03 DIAGNOSIS — D649 Anemia, unspecified: Secondary | ICD-10-CM

## 2013-03-03 DIAGNOSIS — I10 Essential (primary) hypertension: Secondary | ICD-10-CM

## 2013-03-03 DIAGNOSIS — I4891 Unspecified atrial fibrillation: Secondary | ICD-10-CM

## 2013-03-03 DIAGNOSIS — S72009D Fracture of unspecified part of neck of unspecified femur, subsequent encounter for closed fracture with routine healing: Secondary | ICD-10-CM

## 2013-03-03 DIAGNOSIS — S72001D Fracture of unspecified part of neck of right femur, subsequent encounter for closed fracture with routine healing: Secondary | ICD-10-CM

## 2013-03-03 DIAGNOSIS — J449 Chronic obstructive pulmonary disease, unspecified: Secondary | ICD-10-CM

## 2013-03-03 DIAGNOSIS — R609 Edema, unspecified: Secondary | ICD-10-CM

## 2013-03-03 LAB — BASIC METABOLIC PANEL
BUN: 7 mg/dL (ref 6–23)
Calcium: 8.6 mg/dL (ref 8.4–10.5)
GFR: 145.6 mL/min (ref >60.00)
Glucose, Bld: 96 mg/dL (ref 70–99)

## 2013-03-03 LAB — CBC WITH DIFFERENTIAL/PLATELET
Basophils Absolute: 0 10*3/uL (ref 0.0–0.1)
Lymphocytes Relative: 21.4 % (ref 12.0–46.0)
Monocytes Relative: 7.7 % (ref 3.0–12.0)
Platelets: 270 10*3/uL (ref 150.0–400.0)
RDW: 14.9 % — ABNORMAL HIGH (ref 11.5–14.6)

## 2013-03-03 MED ORDER — FLUTICASONE-SALMETEROL 250-50 MCG/DOSE IN AEPB: 1.0000 | INHALATION_SPRAY | Freq: Two times a day (BID) | RESPIRATORY_TRACT | Status: DC

## 2013-03-03 NOTE — Assessment & Plan Note (Signed)
Good rate control On the xarelto

## 2013-03-03 NOTE — Assessment & Plan Note (Signed)
Breathing is normal

## 2013-03-03 NOTE — Progress Notes (Signed)
Subjective:    Patient ID: [name] [name], male    DOB: July 08, 1920, 79 y.o.   MRN: [id number]  HPI Here with daughter and granddaughter Fx hip repaired a little over a month ago Left [facility] 4 days ago Has home PT now Walking with rolling walker  Needs help with dressing and bathing Has been independent going to the bathroom--no incontinence  No chest pain Occ notes fast heart if he takes his oxygen off No syncope  Persistent cough--no worse No fever  Current Outpatient Prescriptions on File Prior to Visit  Medication Sig Dispense Refill  . aspirin 81 MG tablet Take 81 mg by mouth daily.      . Cholecalciferol (VITAMIN D) 2000 UNITS CAPS Take 1 capsule (2,000 Units total) by mouth daily.  30 capsule  99  . diltiazem (CARDIZEM) 120 MG tablet TAKE 1 TABLET BY MOUTH EVERY DAY  30 tablet  11  . ferrous sulfate 325 (65 FE) MG tablet Take 325 mg by mouth 2 (two) times daily with a meal.      . furosemide (LASIX) 20 MG tablet Take 1 tablet (20 mg total) by mouth daily.  90 tablet  3  . ipratropium (ATROVENT) 0.03 % nasal spray Place 2 sprays into the nose 2 (two) times daily.      [name] SPIRIVA HANDIHALER 18 MCG inhalation capsule INHALE 1 CAPSULE VIA HANDIHALER ONCE DAILY AT THE SAME TIME EVERY DAY  30 capsule  1  . XARELTO 20 MG TABS TAKE 1 TABLET BY MOUTH EVERY DAY WITH SUPPER  30 tablet  3   No current facility-administered medications on file prior to visit.    Allergies  Allergen Reactions  . No Known Allergies     Past Medical History  Diagnosis Date  . Cataract     2004- Cataract removal, 2007 Right eye cataract  . COPD (chronic obstructive pulmonary disease)   . Gastric ulcer     1977- Gastric Ulcers with hemorrhage and surgical repair  . Atrial fibrillation   . Hypertension   . Hyperlipidemia   . Stroke   . Pneumonia   . Fracture of femoral neck, right     Past Surgical History  Procedure Laterality Date  . Laceration repair      1983- laceration  right wrist   . Carotid endarterectomy  214    right===Dr Dew  . Right hip hemiarthroplasty      Family History  Problem Relation Age of Onset  . Stroke Mother   . Cancer Neg Hx     History   Social History  . Marital Status: Widowed    Spouse Name: N/A    Number of Children: 3  . Years of Education: N/A   Occupational History  . retired Scientist, product/process development    Social History Main Topics  . Smoking status: Former Smoker    Quit date: 03/07/2011  . Smokeless tobacco: Never Used  . Alcohol Use: Not on file  . Drug Use: Not on file  . Sexual Activity: Not on file   Other Topics Concern  . Not on file   Social History Narrative   Lives with oldest daughter   Has DNR--reviewed and he still wants this         Breathing is okay Review of Systems Appetite is okay Weight is actually up a bit    Objective:   Physical Exam  Constitutional: He appears well-developed and well-nourished. No distress.  Neck: Normal range of  motion. Neck supple. No thyromegaly present.  Cardiovascular: Normal rate and normal heart sounds.  Exam reveals no gallop.   No murmur heard. Irregular   Pulmonary/Chest: Effort normal. No respiratory distress. He has no wheezes. He has no rales.  Decreased breath sounds but clear  Abdominal: Soft. There is no tenderness.  Musculoskeletal: He exhibits no edema and no tenderness.  Lymphadenopathy:    He has no cervical adenopathy.  Psychiatric: He has a normal mood and affect. His behavior is normal.          Assessment & Plan:

## 2013-03-03 NOTE — Assessment & Plan Note (Signed)
BP Readings from Last 3 Encounters:  03/03/13 120/60  02/06/13 124/64  02/02/13 143/72   Good control

## 2013-03-03 NOTE — Assessment & Plan Note (Signed)
Doing well post op On vitamin D Still with home PT Will check DEXA since may have broken before the fall

## 2013-03-08 DIAGNOSIS — S72009D Fracture of unspecified part of neck of unspecified femur, subsequent encounter for closed fracture with routine healing: Secondary | ICD-10-CM

## 2013-03-08 DIAGNOSIS — I4891 Unspecified atrial fibrillation: Secondary | ICD-10-CM

## 2013-03-08 DIAGNOSIS — R262 Difficulty in walking, not elsewhere classified: Secondary | ICD-10-CM

## 2013-03-08 DIAGNOSIS — J449 Chronic obstructive pulmonary disease, unspecified: Secondary | ICD-10-CM

## 2013-03-09 ENCOUNTER — Other Ambulatory Visit: Payer: Self-pay | Admitting: Internal Medicine

## 2013-03-09 ENCOUNTER — Encounter: Payer: Self-pay | Admitting: *Deleted

## 2013-03-09 DIAGNOSIS — M810 Age-related osteoporosis without current pathological fracture: Secondary | ICD-10-CM

## 2013-03-20 NOTE — Progress Notes (Signed)
Patient ID: [name] [name], male   DOB: 1921-01-31, 79 y.o.   MRN: [id number]  [facility]  Allergies  Allergen Reactions  . No Known Allergies      Chief Complaint  Patient presents with  . Acute Visit    edema    HPI:  I have been asked to see him for his lower extremity edema; which has been getting steadily worse. There are no indications that he has a dvt present. He is on xarelto therapy. He is not complaining of pain present.   Past Medical History  Diagnosis Date  . Cataract     2004- Cataract removal, 2007 Right eye cataract  . COPD (chronic obstructive pulmonary disease)   . Gastric ulcer     1977- Gastric Ulcers with hemorrhage and surgical repair  . Atrial fibrillation   . Hypertension   . Hyperlipidemia   . Stroke   . Pneumonia   . Fracture of femoral neck, right     Past Surgical History  Procedure Laterality Date  . Laceration repair      1983- laceration right wrist   . Carotid endarterectomy  214    right===Dr Dew  . Right hip hemiarthroplasty      VITAL SIGNS BP 118/70  Pulse 68  Ht 5\' 11"  (1.803 m)  Wt 160 lb (72.576 kg)  BMI 22.33 kg/m2   Patient's Medications  New Prescriptions   No medications on file  Previous Medications   ASCORBIC ACID (VITAMIN C) 100 MG TABLET    Take 100 mg by mouth daily.   ASPIRIN 81 MG TABLET    Take 81 mg by mouth daily.   CHOLECALCIFEROL (VITAMIN D) 2000 UNITS CAPS    Take 1 capsule (2,000 Units total) by mouth daily.   DILTIAZEM (CARDIZEM) 120 MG TABLET    TAKE 1 TABLET BY MOUTH EVERY DAY   FERROUS SULFATE 325 (65 FE) MG TABLET    Take 325 mg by mouth 2 (two) times daily with a meal.   FUROSEMIDE (LASIX) 20 MG TABLET    Take 1 tablet (20 mg total) by mouth daily.   IPRATROPIUM (ATROVENT) 0.03 % NASAL SPRAY    Place 2 sprays into the nose 2 (two) times daily.   XARELTO 20 MG TABS    TAKE 1 TABLET BY MOUTH EVERY DAY WITH SUPPER  Modified Medications   Modified Medication Previous Medication   FLUTICASONE-SALMETEROL (ADVAIR DISKUS) 250-50 MCG/DOSE AEPB ADVAIR DISKUS 250-50 MCG/DOSE AEPB      Inhale 1 puff into the lungs 2 (two) times daily.    INHALE 1 PUFF BY MOUTH TWICE A DAY   SPIRIVA HANDIHALER 18 MCG INHALATION CAPSULE SPIRIVA HANDIHALER 18 MCG inhalation capsule      INHALE 1 CAPSULE VIA HANDIHALER ONCE DAILY AT THE SAME TIME EVERY DAY    INHALE 1 CAPSULE VIA HANDIHALER ONCE DAILY AT THE SAME TIME EVERY DAY  Discontinued Medications   ACETAMINOPHEN (TYLENOL) 325 MG TABLET    Take 650 mg by mouth every 6 (six) hours.   ALENDRONATE (FOSAMAX) 70 MG TABLET    Take 1 tablet (70 mg total) by mouth every 7 (seven) days. Take with a full glass of water on an empty stomach.   CALCIUM-VITAMIN D (OSCAL WITH D) 500-200 MG-UNIT PER TABLET    Take 1 tablet by mouth 2 (two) times daily.   DOCUSATE CALCIUM (SURFAK) 240 MG CAPSULE    Take 240 mg by mouth at bedtime.   IPRATROPIUM-ALBUTEROL (DUONEB) 0.5-2.5 (  3) MG/3ML SOLN    Take 3 mLs by nebulization every 6 (six) hours as needed.   OXYCODONE (OXY IR/ROXICODONE) 5 MG IMMEDIATE RELEASE TABLET    Take 5 mg by mouth every 6 (six) hours as needed for pain.    SIGNIFICANT DIAGNOSTIC EXAMS   01-25-13: right femur x-ray: an acute subcapital fracture of right hip. Mild angulation at the fracture site.  01-25-13: chest x-ray: increased Insterstitial markings are present within both lungs. This suggests subsegmental atelectasis or interstitial pneumonia.  01-25-13: right elbow x-ray: on acute bony abnormality present.  01-25-13: right hip x-ray: no acute bony abnormality of pelvis 01-26-13: chest x-ray: 1. Small effusion versus chronic scarring of the left lung. 2. interstitial infiltrate.  01-29-13: right hip x-ray; status post total right hip replacement    LABS REVIEWED:   01-25-13: wbc 18.8; hgb 14.0; hct 41.4; mcv 93 ;plt 272 02-01-13: wbc 11.7; hgb 9.4; hct 26.7; mcv 94; plt 173; glucose 113; bun 14; creat0.79; k+4.0 Na++138 02-02-13: wbc 10.5; hgb  9.2; hct 28.8; mcv 96.3; plt 295   Review of Systems  Constitutional: Negative for malaise/fatigue.  Respiratory: Negative for cough and shortness of breath.   Cardiovascular: Negative for chest pain, palpitations HE DOSE HAVE LEG SWELLING Gastrointestinal: Negative for heartburn, abdominal pain and constipation.  Musculoskeletal: Negative for myalgias and joint pain.  Skin: Negative.   Neurological: Negative for headaches.  Psychiatric/Behavioral: The patient is not nervous/anxious and does not have insomnia.     Physical Exam  Constitutional: He appears well-developed and well-nourished.  Neck: Neck supple. No JVD present. No thyromegaly present.  Cardiovascular: Normal rate, regular rhythm and intact distal pulses.   Respiratory: Effort normal and breath sounds normal. No respiratory distress. He has no wheezes.  O2 dependent   GI: Soft. Bowel sounds are normal. He exhibits no distension. There is no tenderness.  Musculoskeletal:  Is able to move all extremities; has 3+ pitting edema right worse than left no signs of dvt present.  Neurological: He is alert.  Skin: Skin is warm and dry.  Incision line without signs of infection or inflammation present; has some swelling and bruising present.          ASSESSMENT/ PLAN:  Edema His edema is worse; will increase his lasix to 40 mg daily and will begin the use of ted hose daily and will monitor his status will check bmp in one week.

## 2013-03-20 NOTE — Assessment & Plan Note (Signed)
His edema is worse; will increase his lasix to 40 mg daily and will begin the use of ted hose daily and will monitor his status will check bmp in one week.

## 2013-03-22 ENCOUNTER — Encounter: Payer: Self-pay | Admitting: Internal Medicine

## 2013-03-22 ENCOUNTER — Telehealth: Payer: Self-pay

## 2013-03-22 ENCOUNTER — Ambulatory Visit: Payer: Self-pay | Admitting: Internal Medicine

## 2013-03-22 NOTE — Telephone Encounter (Signed)
Jamesetta So pts daughter left v/m requesting mild pain med sent to [facility]; since pt has been home having pain in hip that was broken.Pt has taken Tylenol without relief; PT coming to pts home.Please advise.

## 2013-03-22 NOTE — Telephone Encounter (Signed)
Okay to try tramadol 50mg  1/2-1 tab tid prn for the pain #60 x 0

## 2013-03-23 ENCOUNTER — Encounter: Payer: Self-pay | Admitting: *Deleted

## 2013-03-23 MED ORDER — TRAMADOL HCL 50 MG PO TABS: 50.0000 mg | ORAL_TABLET | Freq: Three times a day (TID) | ORAL | Status: DC | PRN

## 2013-03-23 NOTE — Telephone Encounter (Signed)
rx called into pharmacy Spoke with patient's grandson and advised results

## 2013-04-07 ENCOUNTER — Telehealth: Payer: Self-pay

## 2013-04-07 NOTE — Telephone Encounter (Signed)
Pt's daughter Jamesetta So said pt needs refill xarelto and our office not responding to refill request from [facility]; spoke with Clydie Braun at [facility] and she does have Xarelto on hold and will get ready for pick up now. [name] advised.

## 2013-04-24 ENCOUNTER — Other Ambulatory Visit: Payer: Self-pay

## 2013-04-24 MED ORDER — TIOTROPIUM BROMIDE MONOHYDRATE 18 MCG IN CAPS: ORAL_CAPSULE | RESPIRATORY_TRACT | Status: DC

## 2013-04-24 NOTE — Telephone Encounter (Signed)
[name] [name],pts daughter request refill Spiriva to [facility]. Advised done.

## 2013-05-04 ENCOUNTER — Telehealth: Payer: Self-pay | Admitting: Internal Medicine

## 2013-05-04 ENCOUNTER — Emergency Department: Payer: Self-pay | Admitting: Emergency Medicine

## 2013-05-04 LAB — PROTIME-INR
INR: 2.1
Prothrombin Time: 23.1 secs — ABNORMAL HIGH (ref 11.5–14.7)

## 2013-05-04 LAB — CBC
HCT: 37.4 % — ABNORMAL LOW (ref 40.0–52.0)
HGB: 12.8 g/dL — ABNORMAL LOW (ref 13.0–18.0)
MCHC: 34.3 g/dL (ref 32.0–36.0)
Platelet: 224 10*3/uL (ref 150–440)
RDW: 13.6 % (ref 11.5–14.5)
WBC: 9.1 10*3/uL (ref 3.8–10.6)

## 2013-05-04 NOTE — Telephone Encounter (Signed)
Patient Information:  Caller Name: Jamesetta So  Phone: [phone]  Patient: [name] [name], [name] [name]  Gender: Male  DOB: August 08, 1920  Age: 79 Years  PCP: Tillman Abide [facility])  Office Follow Up:  Does the office need to follow up with this patient?: No  Instructions For The Office: N/A  RN Note:  Caller not with Sh[facility] at time of call.  Grandaughter told caller the left ear was still bleeding "a good amount." Agreed to be seen at [facility] ED now.   Symptoms  Reason For Call & Symptoms: Bleeding from left ear that won't stop. On Xarelto.  Denied trauma. Another daugther tried to clean ear with Peroxide 05/03/13 because she thought his ears needed cleaning. Blood found on pillow this morning at 0530; left ear continues to bleed now (since 0530).  Reviewed Health History In EMR: Yes  Reviewed Medications In EMR: Yes  Reviewed Allergies In EMR: Yes  Reviewed Surgeries / Procedures: Yes  Date of Onset of Symptoms: 05/04/2013  Treatments Tried: Applied peroxide to cotton swab to apply pressure in ear to stop bleeding.  Treatments Tried Worked: No  Guideline(s) Used:  Ear - Discharge  Disposition Per Guideline:   Go to ED Now  Reason For Disposition Reached:   Unexplained bleeding and lasts > 10 minutes or large amount (EXCEPTION: if a few drops of blood, continue with triage)  Advice Given:  N/A  Patient Will Follow Care Advice:  YES

## 2013-05-04 NOTE — Telephone Encounter (Signed)
Please check on him later 

## 2013-05-08 NOTE — Telephone Encounter (Signed)
.  left message to have patient's daughter return my call.  

## 2013-05-09 NOTE — Telephone Encounter (Signed)
Not sure if it is ENT or ?plastic surgeon Will wait to get reports

## 2013-05-09 NOTE — Telephone Encounter (Signed)
Spoke with grand-daughter and pt now has to see "specialist", per grand-daughter he did a lot of damage to his ear, ( she couldn't tell me how).

## 2013-06-04 ENCOUNTER — Other Ambulatory Visit: Payer: Self-pay | Admitting: Internal Medicine

## 2013-06-06 ENCOUNTER — Ambulatory Visit: Payer: Medicare Other | Admitting: Internal Medicine

## 2013-06-07 ENCOUNTER — Ambulatory Visit: Payer: Medicare Other | Admitting: Internal Medicine

## 2013-06-20 ENCOUNTER — Ambulatory Visit (INDEPENDENT_AMBULATORY_CARE_PROVIDER_SITE_OTHER): Payer: Medicare Other | Admitting: Internal Medicine

## 2013-06-20 ENCOUNTER — Encounter: Payer: Self-pay | Admitting: Internal Medicine

## 2013-06-20 VITALS — BP 118/58 | HR 50 | Temp 96.8°F | Wt 147.0 lb

## 2013-06-20 DIAGNOSIS — I10 Essential (primary) hypertension: Secondary | ICD-10-CM

## 2013-06-20 DIAGNOSIS — I4891 Unspecified atrial fibrillation: Secondary | ICD-10-CM

## 2013-06-20 DIAGNOSIS — J449 Chronic obstructive pulmonary disease, unspecified: Secondary | ICD-10-CM

## 2013-06-20 DIAGNOSIS — M810 Age-related osteoporosis without current pathological fracture: Secondary | ICD-10-CM

## 2013-06-20 DIAGNOSIS — L570 Actinic keratosis: Secondary | ICD-10-CM

## 2013-06-20 NOTE — Progress Notes (Signed)
Subjective:    Patient ID: [name] [name], male    DOB: 03/16/1921, 79 y.o.   MRN: [id number]  HPI Here with granddaughter--Jennifer  Still gets some soreness in hip and knee on standing Uses the tramadol intermittently Walks holding onto family--- doesn't like using walker Needs help with dressing and bathing Uses diaper just in case---goes to bathroom independently Family handle all instrumental ADLS  No palpitations No chest pain No edema  Has some itching on back Takes shower ---uses bar soap Discussed using moisturizers and moisturizing soap  Breathing is okay Still with regular cough--- mostly dry Able to walk in house without dyspnea Doesn't go away from house much---Sunday breakfast out  Also painful lesion on right neck  Current Outpatient Prescriptions on File Prior to Visit  Medication Sig Dispense Refill  . ADVAIR DISKUS 250-50 MCG/DOSE AEPB INHALE 1 PUFF BY MOUTH TWICE A DAY  60 each  1  . Ascorbic Acid (VITAMIN C) 100 MG tablet Take 100 mg by mouth daily.      . aspirin 81 MG tablet Take 81 mg by mouth daily.      . Cholecalciferol (VITAMIN D) 2000 UNITS CAPS Take 1 capsule (2,000 Units total) by mouth daily.  30 capsule  99  . diltiazem (CARDIZEM) 120 MG tablet TAKE 1 TABLET BY MOUTH EVERY DAY  30 tablet  1  . ferrous sulfate 325 (65 FE) MG tablet Take 325 mg by mouth 2 (two) times daily with a meal.      . furosemide (LASIX) 20 MG tablet Take 1 tablet (20 mg total) by mouth daily.  90 tablet  3  . ipratropium (ATROVENT) 0.03 % nasal spray Place 2 sprays into the nose 2 (two) times daily.      . SPIRIVA HANDIHALER 18 MCG inhalation capsule INHALE 1 CAPSULE VIA HANDIHALER ONCE DAILY AT THE SAME TIME EVERY DAY  30 capsule  11  . traMADol (ULTRAM) 50 MG tablet Take 1-2 tablets (50-100 mg total) by mouth 3 (three) times daily as needed for pain.  60 tablet  0  . XARELTO 20 MG TABS tablet TAKE 1 TABLET BY MOUTH EVERY DAY WITH SUPPER  30 tablet  3   No current  facility-administered medications on file prior to visit.    Allergies  Allergen Reactions  . No Known Allergies     Past Medical History  Diagnosis Date  . Cataract     2004- Cataract removal, 2007 Right eye cataract  . COPD (chronic obstructive pulmonary disease)   . Gastric ulcer     1977- Gastric Ulcers with hemorrhage and surgical repair  . Atrial fibrillation   . Hypertension   . Hyperlipidemia   . Stroke   . Pneumonia   . Fracture of femoral neck, right     Past Surgical History  Procedure Laterality Date  . Laceration repair      19 83- laceration right wrist   . Carotid endarterectomy  214    right===Dr Dew  . Right hip hemiarthroplasty      Family History  Problem Relation Age of Onset  . Stroke Mother   . Cancer Neg Hx     History   Social History  . Marital Status: Widowed    Spouse Name: N/A    Number of Children: 3  . Years of Education: N/A   Occupational History  . retired Scientist, product/process development    Social History Main Topics  . Smoking status: Former Smoker  Quit date: 03/07/2011  . Smokeless tobacco: Never Used  . Alcohol Use: Not on file  . Drug Use: Not on file  . Sexual Activity: Not on file   Other Topics Concern  . Not on file   Social History Narrative   Lives with oldest daughter   Has DNR--reviewed and he still wants this         Review of Systems Satisfied with things Still doesn't sleep okay--naps on and off all day and night    Objective:   Physical Exam  Constitutional: He appears well-developed. No distress.  Neck: Normal range of motion. Neck supple.  Cardiovascular: Normal rate and normal heart sounds.  Exam reveals no gallop.   No murmur heard. irregular  Pulmonary/Chest: Effort normal. No respiratory distress. He has no wheezes. He has no rales.  Decreased breath sounds Slightly prolonged exp phase  Musculoskeletal: He exhibits no edema and no tenderness.  Lymphadenopathy:    He has no cervical adenopathy.    Skin:  Dry skin on back Irritated actinic vs cutaneous horn--- ~7-53mm on right neck  Psychiatric: He has a normal mood and affect. His behavior is normal.          Assessment & Plan:

## 2013-06-20 NOTE — Assessment & Plan Note (Signed)
Liquid nitrogen 45 seconds x 2 Discussed home care Told granddaughter--if it persists or recurs, to derm

## 2013-06-20 NOTE — Assessment & Plan Note (Signed)
Good rate control On xarelto 

## 2013-06-20 NOTE — Assessment & Plan Note (Signed)
BP Readings from Last 3 Encounters:  06/20/13 118/58  03/03/13 120/60  02/09/13 118/70   Good control No changes

## 2013-06-20 NOTE — Progress Notes (Signed)
Pre-visit discussion using our clinic review tool. No additional management support is needed unless otherwise documented below in the visit note.  

## 2013-06-20 NOTE — Assessment & Plan Note (Signed)
Doing okay on current meds Has care for ADLs living with family

## 2013-06-20 NOTE — Patient Instructions (Signed)
If the mole on your neck returns, you will need to see a dermatologist

## 2013-06-20 NOTE — Assessment & Plan Note (Signed)
Only in forearm Continuing the vitamin D only

## 2013-07-25 ENCOUNTER — Telehealth: Payer: Self-pay

## 2013-07-25 NOTE — Telephone Encounter (Signed)
[name] [name] pts daughter left v/m pt is out of Spiriva and [name] [name] wants to know if pt needs Spiriva since pt is on Advair; Spiriva cost is $200 plus. If pt needs to take Karma GreaserSpiriva [name] [facility]. When pt was discharged from nursing home pt was using nasal spray and wants to know if pt could get [facility]ill [facility]ropium [facility][name] request cb.

## 2013-07-26 MED ORDER — IPRATROPIUM BROMIDE 0.03 % NA SOLN: 2.0000 | Freq: Two times a day (BID) | NASAL | Status: DC

## 2013-07-26 NOTE — Telephone Encounter (Signed)
Spoke with daughter and advised results. rx sent to pharmacy by e-script  

## 2013-07-26 NOTE — Telephone Encounter (Signed)
There is no generic for spiriva combivent inhaler is similar med with albuterol but needs to be taken 3-4 times a day---and is also expensive  I guess he could try without it--and see if his breathing is any worse  Okay to refill the ipratropium nasal (0.03% one)

## 2013-08-30 ENCOUNTER — Other Ambulatory Visit: Payer: Self-pay | Admitting: Internal Medicine

## 2013-09-28 ENCOUNTER — Other Ambulatory Visit: Payer: Self-pay | Admitting: Internal Medicine

## 2013-10-28 ENCOUNTER — Other Ambulatory Visit: Payer: Self-pay | Admitting: Internal Medicine

## 2013-10-30 NOTE — Telephone Encounter (Signed)
Ok to fill 

## 2013-10-30 NOTE — Telephone Encounter (Signed)
rx sent to pharmacy by e-script  

## 2013-10-30 NOTE — Telephone Encounter (Signed)
Okay to fill for a year 

## 2013-12-06 ENCOUNTER — Emergency Department: Payer: Self-pay | Admitting: Emergency Medicine

## 2013-12-24 ENCOUNTER — Other Ambulatory Visit: Payer: Self-pay | Admitting: Internal Medicine

## 2014-02-28 ENCOUNTER — Other Ambulatory Visit: Payer: Self-pay | Admitting: Internal Medicine

## 2014-04-28 IMAGING — CT CT CHEST W/ CM
1 of 2 series · 14 of 32 positions shown, 18 images · non-contrast
Comparison: none

REASON FOR EXAM: sob. hypoxia, r/o PE
COMMENTS:

[Series 5: lung windows · axial · 0.77mm/px · z∈[-742,-430]mm · 14 of 124 slices shown, 18 images]
[im 10/124  mediastinal]
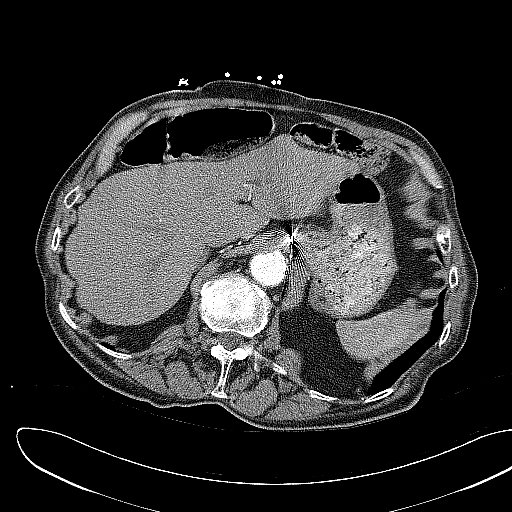
[im 10/124  lung]
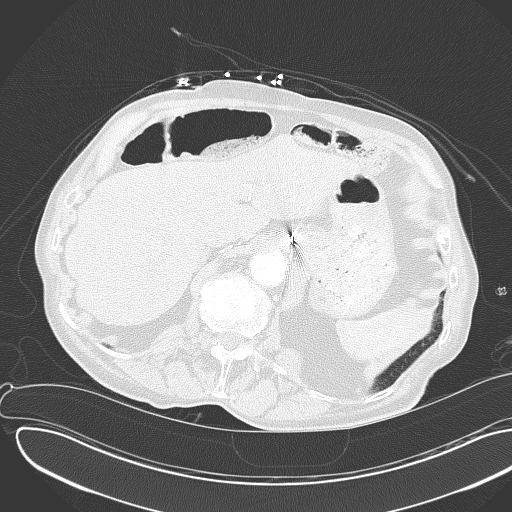
[im 19/124  lung]
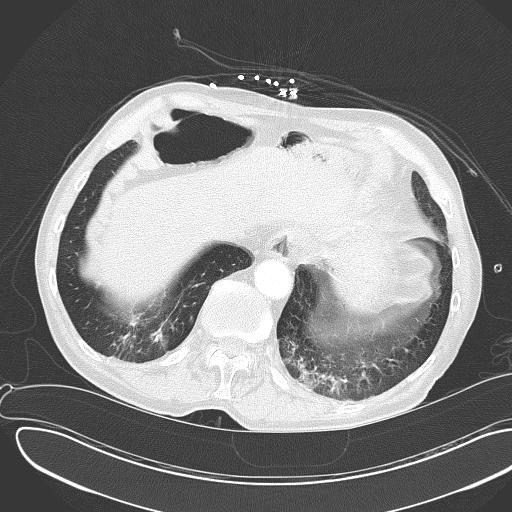
[im 29/124  lung]
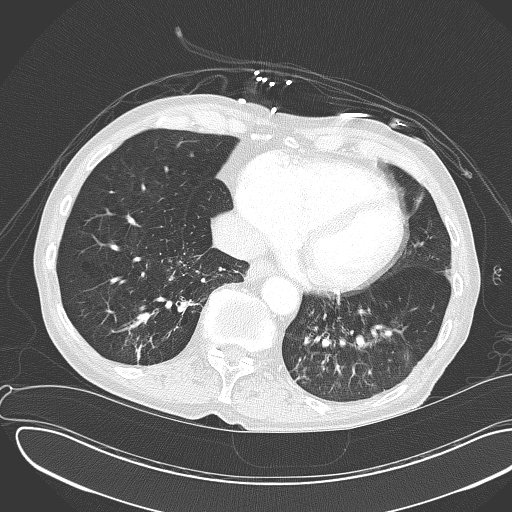
[im 38/124  lung]
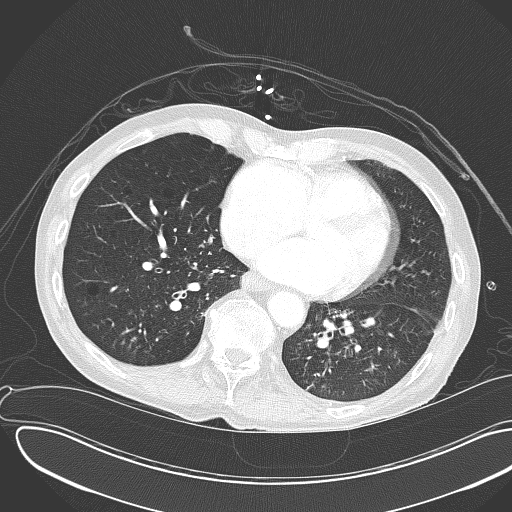
[im 48/124  mediastinal]
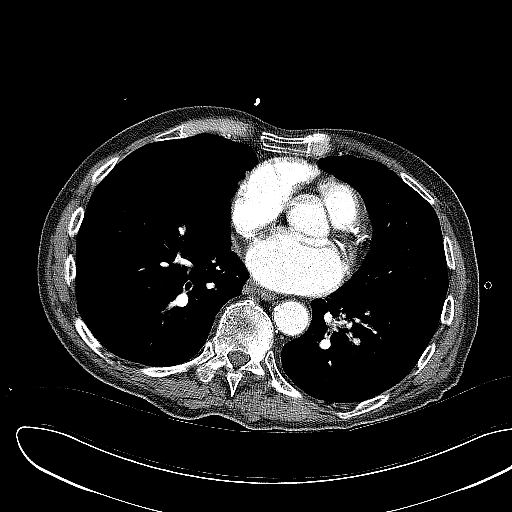
[im 48/124  lung]
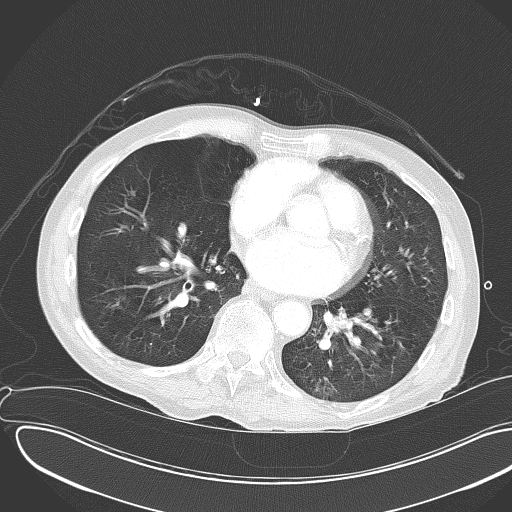
[im 57/124  lung]
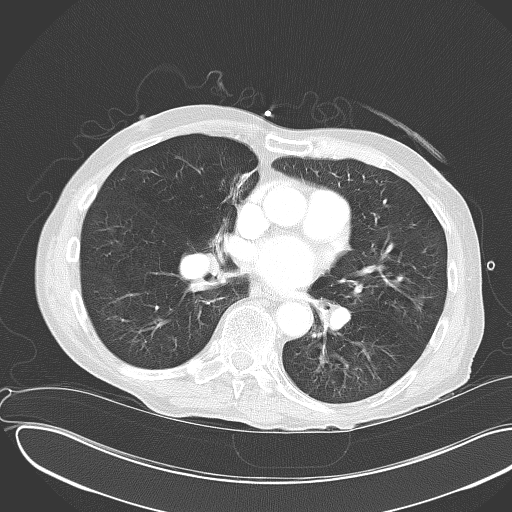
[im 58/124  lung]
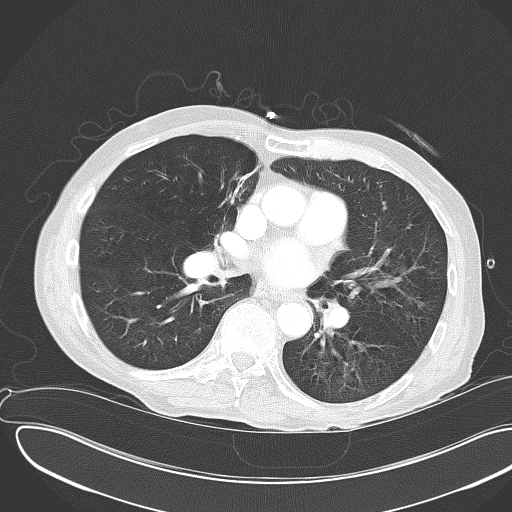
[im 62/124  lung]
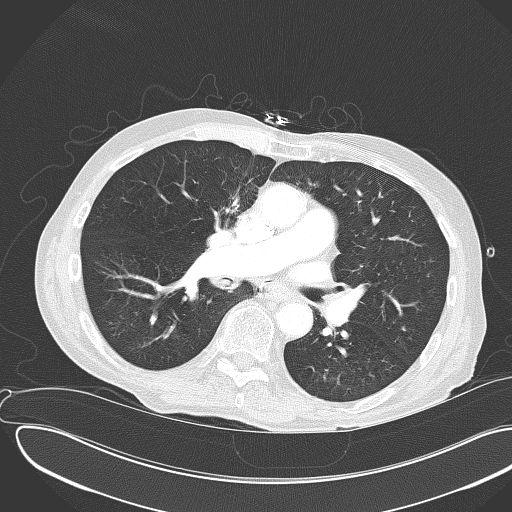
[im 67/124  mediastinal]
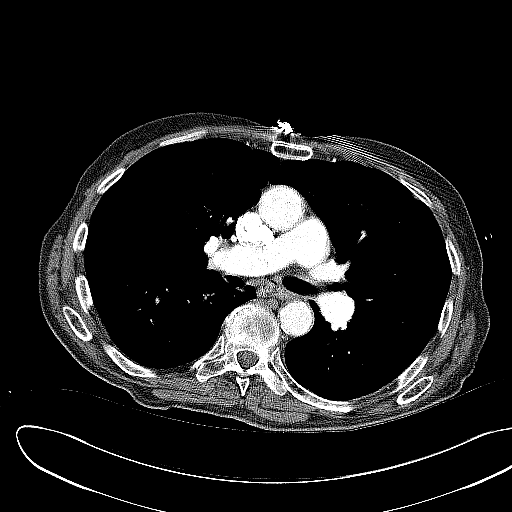
[im 67/124  lung]
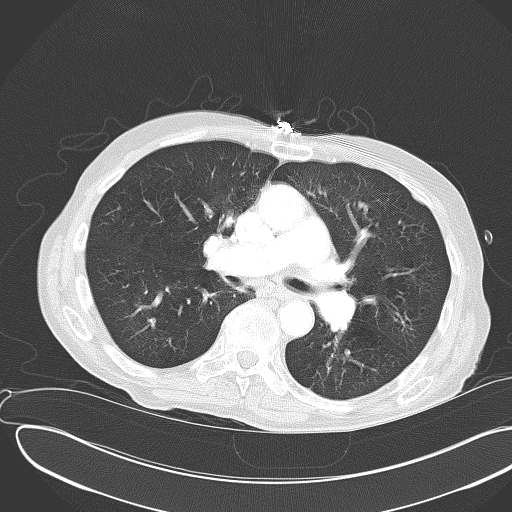
[im 76/124  lung]
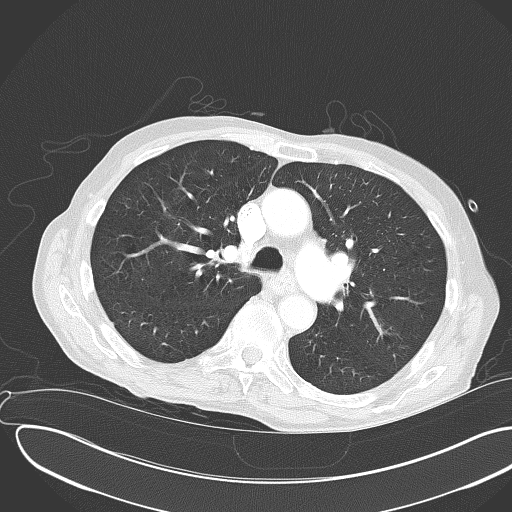
[im 86/124  lung]
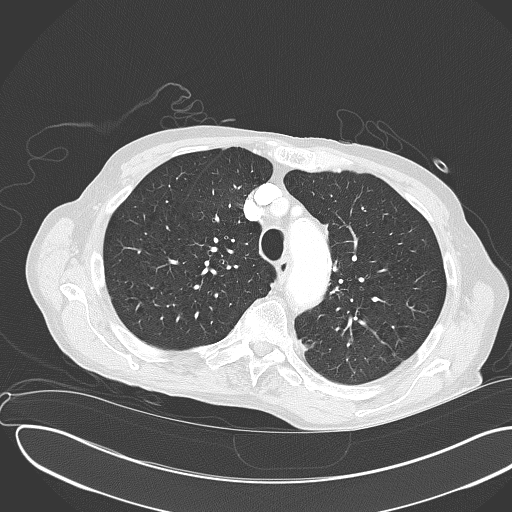
[im 95/124  lung]
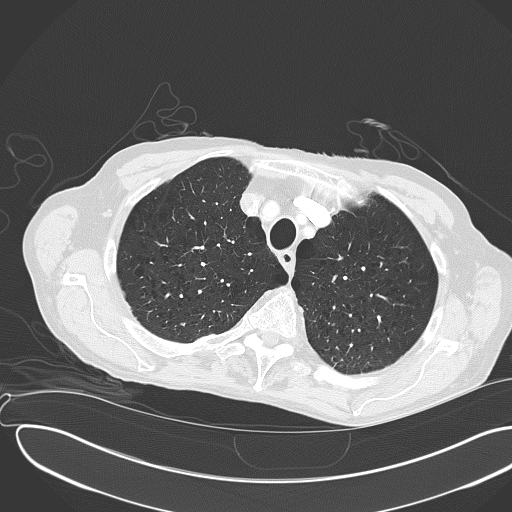
[im 105/124  mediastinal]
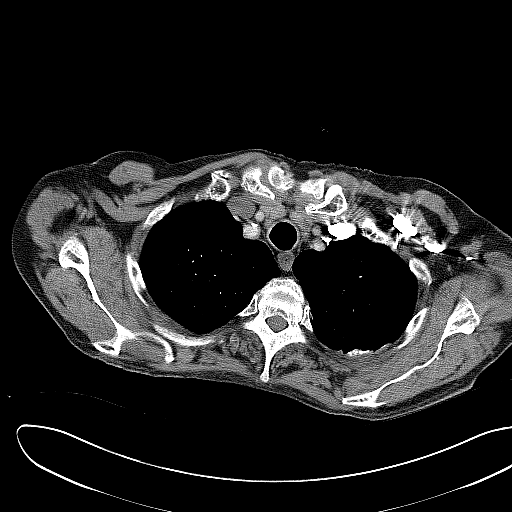
[im 105/124  lung]
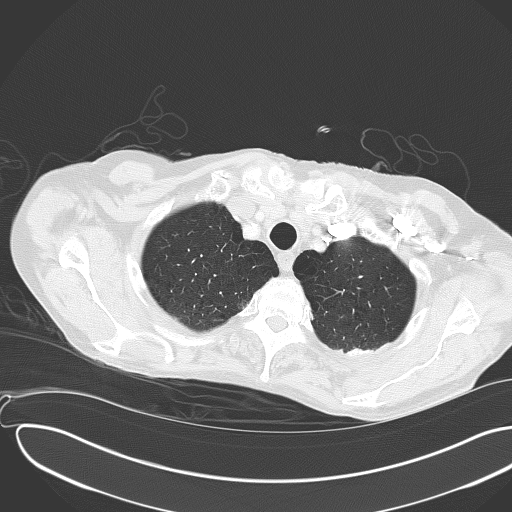
[im 114/124  lung]
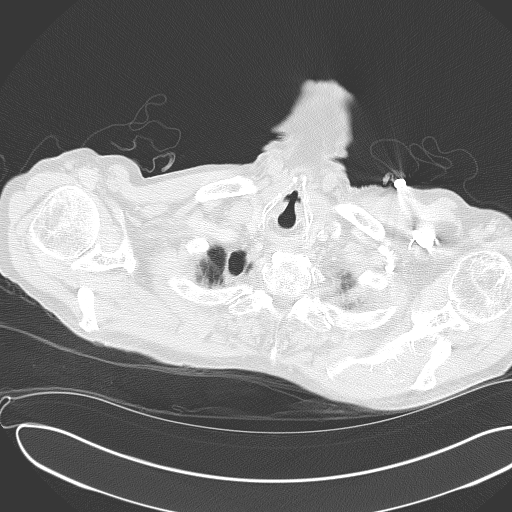

[14 of 32 positions shown; findings below may reference images not displayed]

PROCEDURE:     CT  - CT CHEST (FOR PE) W  - June 09, 2012  [DATE]

RESULT:     Axial CT scanning was performed through the chest with
reconstructions at 3 mm intervals and slice thicknesses. Comparison is made
to a chest x-ray 08 June, 2012. The patient received 100 cc of
[id number] for today's study. Review of multiplanar reconstructed images was
performed separately on the VIA monitor.

Contrast within the pulmonary arterial tree is normal in appearance. There
are no filling defects to suggest an acute pulmonary embolism. The caliber
of the thoracic aorta is normal. There is no evidence of a false lumen. The
cardiac chambers are normal in size. There is no pleural nor pericardial
effusion. There is no mediastinal nor hilar lymphadenopathy.

At lung window settings there is pleural based increased density in the
upper lobes posteriorly. This may reflect subsegmental atelectasis or
scarring. There are emphysematous changes diffusely. There are coarse lung
markings in both lower lobes which suggests subsegmental atelectasis. There
is no pulmonary parenchymal mass. There are a few 2 to 3 millimeter diameter
nodules in both lungs which are nonspecific.

Within the upper abdomen the observed portions of the liver and spleen
exhibit no acute abnormality. A hypodensity in the right hepatic lobe on
image 127 exhibits Hounsfield measurement of +7 and is most compatible with
an 11 mm diameter cyst. There are no adrenal masses.
IMPRESSION: 1. There is no evidence of an acute pulmonary embolism.
2. There are emphysematous changes diffusely. There is subsegmental
atelectasis versus early interstitial infiltrate in the posterior aspects of
both lower lobes as well is in the upper lobes. A portion this could reflect
scarring as well.
3. There is no mediastinal nor hilar lymphadenopathy.
4. There is no pleural nor pericardial effusion or evidence of CHF.
5. There are scattered 2 to 3 mm diameter soft tissue density nodules in
both lungs which are likely inflammatory but which are nonspecific. When the
patient has recovered from her acute illness, followup noncontrast chest CT
scanning at 4 to 6 month intervals for the next 2 years is recommended to
assure ongoing instability.

[REDACTED]

## 2014-04-29 ENCOUNTER — Other Ambulatory Visit: Payer: Self-pay | Admitting: Internal Medicine

## 2014-05-09 ENCOUNTER — Telehealth: Payer: Self-pay

## 2014-05-09 NOTE — Telephone Encounter (Signed)
Called LMOV[facility] for daughter to call back to set up Annual Medicare Wellness for 2015.

## 2014-05-14 ENCOUNTER — Other Ambulatory Visit: Payer: Self-pay | Admitting: Internal Medicine

## 2014-06-30 ENCOUNTER — Other Ambulatory Visit: Payer: Self-pay | Admitting: Internal Medicine

## 2014-07-03 ENCOUNTER — Other Ambulatory Visit: Payer: Self-pay | Admitting: Internal Medicine

## 2014-07-11 ENCOUNTER — Ambulatory Visit (INDEPENDENT_AMBULATORY_CARE_PROVIDER_SITE_OTHER): Payer: Medicare Other | Admitting: Internal Medicine

## 2014-07-11 ENCOUNTER — Encounter: Payer: Self-pay | Admitting: Internal Medicine

## 2014-07-11 VITALS — BP 120/80 | HR 85 | Temp 97.7°F | Ht 71.0 in | Wt 136.0 lb

## 2014-07-11 DIAGNOSIS — L570 Actinic keratosis: Secondary | ICD-10-CM | POA: Diagnosis not present

## 2014-07-11 DIAGNOSIS — I481 Persistent atrial fibrillation: Secondary | ICD-10-CM

## 2014-07-11 DIAGNOSIS — E440 Moderate protein-calorie malnutrition: Secondary | ICD-10-CM

## 2014-07-11 DIAGNOSIS — Z0000 Encounter for general adult medical examination without abnormal findings: Secondary | ICD-10-CM | POA: Diagnosis not present

## 2014-07-11 DIAGNOSIS — I10 Essential (primary) hypertension: Secondary | ICD-10-CM

## 2014-07-11 DIAGNOSIS — E441 Mild protein-calorie malnutrition: Secondary | ICD-10-CM | POA: Insufficient documentation

## 2014-07-11 DIAGNOSIS — Z7189 Other specified counseling: Secondary | ICD-10-CM

## 2014-07-11 DIAGNOSIS — F0150 Vascular dementia without behavioral disturbance: Secondary | ICD-10-CM | POA: Diagnosis not present

## 2014-07-11 DIAGNOSIS — J449 Chronic obstructive pulmonary disease, unspecified: Secondary | ICD-10-CM | POA: Diagnosis not present

## 2014-07-11 DIAGNOSIS — I4819 Other persistent atrial fibrillation: Secondary | ICD-10-CM

## 2014-07-11 LAB — COMPREHENSIVE METABOLIC PANEL
ALK PHOS: 110 U/L (ref 39–117)
ALT: 8 U/L (ref 0–53)
AST: 17 U/L (ref 0–37)
Albumin: 3.3 g/dL — ABNORMAL LOW (ref 3.5–5.2)
BUN: 13 mg/dL (ref 6–23)
CALCIUM: 8.9 mg/dL (ref 8.4–10.5)
CHLORIDE: 103 meq/L (ref 96–112)
CO2: 26 mEq/L (ref 19–32)
Creatinine, Ser: 0.9 mg/dL (ref 0.4–1.5)
GFR: 85.05 mL/min (ref >60.00)
GLUCOSE: 100 mg/dL — AB (ref 70–99)
Potassium: 4.3 mEq/L (ref 3.5–5.1)
SODIUM: 137 meq/L (ref 135–145)
Total Bilirubin: 0.6 mg/dL (ref 0.2–1.2)
Total Protein: 8 g/dL (ref 6.0–8.3)

## 2014-07-11 LAB — CBC WITH DIFFERENTIAL/PLATELET
Basophils Absolute: 0 10*3/uL (ref 0.0–0.1)
Basophils Relative: 0.3 % (ref 0.0–3.0)
EOS ABS: 0.1 10*3/uL (ref 0.0–0.7)
Eosinophils Relative: 0.9 % (ref 0.0–5.0)
HEMATOCRIT: 37.8 % — AB (ref 39.0–52.0)
HEMOGLOBIN: 12.1 g/dL — AB (ref 13.0–17.0)
LYMPHS ABS: 1.8 10*3/uL (ref 0.7–4.0)
LYMPHS PCT: 18.7 % (ref 12.0–46.0)
MCHC: 32.1 g/dL (ref 30.0–36.0)
MCV: 95.5 fl (ref 78.0–100.0)
MONOS PCT: 6.3 % (ref 3.0–12.0)
Monocytes Absolute: 0.6 10*3/uL (ref 0.1–1.0)
NEUTROS ABS: 7.3 10*3/uL (ref 1.4–7.7)
Neutrophils Relative %: 73.8 % (ref 43.0–77.0)
PLATELETS: 321 10*3/uL (ref 150.0–400.0)
RBC: 3.96 Mil/uL — ABNORMAL LOW (ref 4.22–5.81)
RDW: 15.4 % (ref 11.5–15.5)
WBC: 9.9 10*3/uL (ref 4.0–10.5)

## 2014-07-11 LAB — T4, FREE: FREE T4: 0.95 ng/dL (ref 0.60–1.60)

## 2014-07-11 NOTE — Patient Instructions (Signed)
Please start boost or ensure, 1 can three times a day till his weight is back up to 150# or so.

## 2014-07-11 NOTE — Assessment & Plan Note (Signed)
15# weight loss this year Asked them to start supplements

## 2014-07-11 NOTE — Assessment & Plan Note (Signed)
Scaly, hyperkeratotic lesion of left forearm--treated with liquid nitrogen 1 minute x 2 Tolerated well Discussed home care

## 2014-07-11 NOTE — Assessment & Plan Note (Signed)
Clear abnormality but does okay because he has family with him all the time No Rx

## 2014-07-11 NOTE — Assessment & Plan Note (Signed)
I have personally reviewed the Medicare Annual Wellness questionnaire and have noted 1. The patient's medical and social history 2. Their use of alcohol, tobacco or illicit drugs 3. Their current medications and supplements 4. The patient's functional ability including ADL's, fall risks, home safety risks and hearing or visual             impairment. 5. Diet and physical activities 6. Evidence for depression or mood disorders  The patients weight, height, BMI and visual acuity have been recorded in the chart I have made referrals, counseling and provided education to the patient based review of the above and I have provided the pt with a written personalized care plan for preventive services.  I have provided you with a copy of your personalized plan for preventive services. Please take the time to review along with your updated medication list.  Doesn't take vaccines--- despite my urging No cancer screening due to age

## 2014-07-11 NOTE — Progress Notes (Signed)
Subjective:    Patient ID: [name] [name], male    DOB: 09/24/1920, 81 y.o.   MRN: [id number]  HPI Here with daughter, [name] [name]. For Medicare wellness and follow up of multiple medical problems Reviewed form and advanced directives Lives with daughter [name] [name] stays with him while [name] [name] at work--- never alone (other than brief spells like 30 minutes) Did have 1 fall--mild injury but x-rays okay. Walks with rolling walker Needs help with dressing, bathing. Uses bathroom on his own (generally continent). No instrumental ADLs Reviewed other physicians Hearing and vision are poor Some down moods and anhedonia--just because he can't do too much No clear cognitive decline  No chest pain No palpitations No significant SOB lately--last ER visit 2 years ago Occasional cough--some sputum No dizziness or syncope No edema  Appetite is not great Has lost weight  Saw Dr Martha ClanKrasinski after fall Not recently No meds now for pain  Current Outpatient Prescriptions on File Prior to Visit  Medication Sig Dispense Refill  . ADVAIR DISKUS 250-50 MCG/DOSE AEPB INHALE 1 DOSE BY MOUTH TWICE DAILY. RINSE MOUTH AFTER USE 60 each 1  . aspirin 81 MG tablet Take 81 mg by mouth daily.    [name]. diltiazem (CARDIZEM) 120 MG tablet Take 1 tablet (120 mg total) by mouth daily. 30 tablet 0  . furosemide (LASIX) 20 MG tablet TAKE 1 TABLET BY MOUTH ONCE A DAY 90 tablet 3  . XARELTO 20 MG TABS tablet TAKE 1 TABLET BY MOUTH EVERY DAY WITH SUPPER 30 tablet 11   No current facility-administered medications on file prior to visit.    Allergies  Allergen Reactions  . No Known Allergies     Past Medical History  Diagnosis Date  . Cataract     2004- Cataract removal, 2007 Right eye cataract  . COPD (chronic obstructive pulmonary disease)   . Gastric ulcer     1977- Gastric Ulcers with hemorrhage and surgical repair  . Atrial fibrillation   . Hypertension   . Hyperlipidemia   . Stroke   . Pneumonia   .  Fracture of femoral neck, right     Past Surgical History  Procedure Laterality Date  . Laceration repair      1983- laceration right wrist   . Carotid endarterectomy  214    right===Dr Dew  . Right hip hemiarthroplasty    . Fracture surgery Right 2014    hip    Family History  Problem Relation Age of Onset  . Stroke Mother   . Cancer Neg Hx     History   Social History  . Marital Status: Widowed    Spouse Name: N/A    Number of Children: 3  . Years of Education: N/A   Occupational History  . retired Scientist, product/process developmentTextile worker    Social History Main Topics  . Smoking status: Former Smoker    Quit date: 03/07/2011  . Smokeless tobacco: Never Used  . Alcohol Use: Not on file  . Drug Use: Not on file  . Sexual Activity: Not on file   Other Topics Concern  . Not on file   Social History Narrative   Lives with oldest daughter   [name] Sohyllis should make decisions for him   Has DNR--reviewed and he still wants this         Review of Systems Bowels are still slow--- gets MOM when needed Voids okay--occasional nocturia Has new lesion on left arm--he asks for freezing treatment    Objective:  Physical Exam  Constitutional: He appears well-developed. No distress.  Clear wasting  HENT:  Mouth/Throat: Oropharynx is clear and moist. No oropharyngeal exudate.  Neck: Normal range of motion. Neck supple. No thyromegaly present.  Cardiovascular: Normal rate and normal heart sounds.  Exam reveals no gallop.   No murmur heard. Irregular Faint pulse in left foot--absent on right  Pulmonary/Chest: Effort normal and breath sounds normal. No respiratory distress. He has no wheezes. He has no rales.  Abdominal: Soft. There is no tenderness.  Musculoskeletal: He exhibits no edema or tenderness.  Lymphadenopathy:    He has no cervical adenopathy.  Neurological: He is alert.  "February" Doesn't know year.  "doctor's office, [place]" President -- "Reagan" 100-97.... Can't spell world  backwards Recall 0/3  Skin: No rash noted. No erythema.  Psychiatric: He has a normal mood and affect. His behavior is normal.          Assessment & Plan:

## 2014-07-11 NOTE — Assessment & Plan Note (Signed)
Clear emphysema with decreased breath sounds Some element of bronchitis Off spiriva now and stable status

## 2014-07-11 NOTE — Assessment & Plan Note (Signed)
Rate is controlled Remains on xarelto

## 2014-07-11 NOTE — Assessment & Plan Note (Signed)
Has DNR 

## 2014-07-11 NOTE — Assessment & Plan Note (Signed)
BP Readings from Last 3 Encounters:  07/11/14 120/80  06/20/13 118/58  03/03/13 120/60   Good control

## 2014-07-11 NOTE — Progress Notes (Signed)
Pre visit review using our clinic review tool, if applicable. No additional management support is needed unless otherwise documented below in the visit note. 

## 2014-07-12 ENCOUNTER — Encounter: Payer: Self-pay | Admitting: *Deleted

## 2014-07-14 DIAGNOSIS — J449 Chronic obstructive pulmonary disease, unspecified: Secondary | ICD-10-CM | POA: Diagnosis not present

## 2014-07-18 ENCOUNTER — Telehealth: Payer: Self-pay | Admitting: Internal Medicine

## 2014-07-18 ENCOUNTER — Inpatient Hospital Stay: Payer: Self-pay | Admitting: Internal Medicine

## 2014-07-18 DIAGNOSIS — R9431 Abnormal electrocardiogram [ECG] [EKG]: Secondary | ICD-10-CM | POA: Diagnosis not present

## 2014-07-18 DIAGNOSIS — Z87891 Personal history of nicotine dependence: Secondary | ICD-10-CM | POA: Diagnosis not present

## 2014-07-18 DIAGNOSIS — J449 Chronic obstructive pulmonary disease, unspecified: Secondary | ICD-10-CM | POA: Diagnosis not present

## 2014-07-18 DIAGNOSIS — Z9861 Coronary angioplasty status: Secondary | ICD-10-CM | POA: Diagnosis not present

## 2014-07-18 DIAGNOSIS — J90 Pleural effusion, not elsewhere classified: Secondary | ICD-10-CM | POA: Diagnosis not present

## 2014-07-18 DIAGNOSIS — J441 Chronic obstructive pulmonary disease with (acute) exacerbation: Secondary | ICD-10-CM | POA: Diagnosis not present

## 2014-07-18 DIAGNOSIS — Z7982 Long term (current) use of aspirin: Secondary | ICD-10-CM | POA: Diagnosis not present

## 2014-07-18 DIAGNOSIS — Z9981 Dependence on supplemental oxygen: Secondary | ICD-10-CM | POA: Diagnosis not present

## 2014-07-18 DIAGNOSIS — Z66 Do not resuscitate: Secondary | ICD-10-CM | POA: Diagnosis not present

## 2014-07-18 DIAGNOSIS — I2510 Atherosclerotic heart disease of native coronary artery without angina pectoris: Secondary | ICD-10-CM | POA: Diagnosis not present

## 2014-07-18 DIAGNOSIS — J189 Pneumonia, unspecified organism: Secondary | ICD-10-CM | POA: Diagnosis not present

## 2014-07-18 DIAGNOSIS — A419 Sepsis, unspecified organism: Secondary | ICD-10-CM | POA: Diagnosis not present

## 2014-07-18 DIAGNOSIS — I481 Persistent atrial fibrillation: Secondary | ICD-10-CM | POA: Diagnosis not present

## 2014-07-18 DIAGNOSIS — I4891 Unspecified atrial fibrillation: Secondary | ICD-10-CM | POA: Diagnosis not present

## 2014-07-18 DIAGNOSIS — Z8673 Personal history of transient ischemic attack (TIA), and cerebral infarction without residual deficits: Secondary | ICD-10-CM | POA: Diagnosis not present

## 2014-07-18 DIAGNOSIS — J9611 Chronic respiratory failure with hypoxia: Secondary | ICD-10-CM | POA: Diagnosis not present

## 2014-07-18 LAB — CBC WITH DIFFERENTIAL/PLATELET
BASOS PCT: 0.4 %
Basophil #: 0.1 10*3/uL (ref 0.0–0.1)
EOS ABS: 0.1 10*3/uL (ref 0.0–0.7)
Eosinophil %: 0.3 %
HCT: 39.1 % — ABNORMAL LOW (ref 40.0–52.0)
HGB: 12.2 g/dL — AB (ref 13.0–18.0)
LYMPHS PCT: 13.6 %
Lymphocyte #: 2.8 10*3/uL (ref 1.0–3.6)
MCH: 30.6 pg (ref 26.0–34.0)
MCHC: 31.2 g/dL — ABNORMAL LOW (ref 32.0–36.0)
MCV: 98 fL (ref 80–100)
Monocyte #: 0.8 x10 3/mm (ref 0.2–1.0)
Monocyte %: 4 %
NEUTROS PCT: 81.7 %
Neutrophil #: 16.7 10*3/uL — ABNORMAL HIGH (ref 1.4–6.5)
PLATELETS: 348 10*3/uL (ref 150–440)
RBC: 4 10*6/uL — AB (ref 4.40–5.90)
RDW: 15 % — ABNORMAL HIGH (ref 11.5–14.5)
WBC: 20.5 10*3/uL — ABNORMAL HIGH (ref 3.8–10.6)

## 2014-07-18 LAB — COMPREHENSIVE METABOLIC PANEL
ALBUMIN: 2.8 g/dL — AB (ref 3.4–5.0)
Alkaline Phosphatase: 130 U/L — ABNORMAL HIGH
Anion Gap: 10 (ref 7–16)
BUN: 16 mg/dL (ref 7–18)
Bilirubin,Total: 0.3 mg/dL (ref 0.2–1.0)
Calcium, Total: 8.2 mg/dL — ABNORMAL LOW (ref 8.5–10.1)
Chloride: 103 mmol/L (ref 98–107)
Co2: 26 mmol/L (ref 21–32)
Creatinine: 1.12 mg/dL (ref 0.60–1.30)
EGFR (Non-African Amer.): >60
GLUCOSE: 175 mg/dL — AB (ref 65–99)
OSMOLALITY: 283 (ref 275–301)
Potassium: 4.7 mmol/L (ref 3.5–5.1)
SGOT(AST): 23 U/L (ref 15–37)
SGPT (ALT): 13 U/L — ABNORMAL LOW
SODIUM: 139 mmol/L (ref 136–145)
TOTAL PROTEIN: 7.9 g/dL (ref 6.4–8.2)

## 2014-07-18 LAB — URINALYSIS, COMPLETE
BILIRUBIN, UR: NEGATIVE
BLOOD: NEGATIVE
Bacteria: NONE SEEN
GLUCOSE, UR: NEGATIVE mg/dL (ref 0–75)
Hyaline Cast: 12
Ketone: NEGATIVE
LEUKOCYTE ESTERASE: NEGATIVE
Nitrite: NEGATIVE
Ph: 6 (ref 4.5–8.0)
Protein: NEGATIVE
RBC,UR: 3 /HPF (ref 0–5)
Specific Gravity: 1.019 (ref 1.003–1.030)
WBC UR: 1 /HPF (ref 0–5)

## 2014-07-18 LAB — PROTIME-INR
INR: 1.6
Prothrombin Time: 18.5 secs — ABNORMAL HIGH (ref 11.5–14.7)

## 2014-07-18 LAB — MAGNESIUM: Magnesium: 2 mg/dL

## 2014-07-18 LAB — TROPONIN I: Troponin-I: <0.02 ng/mL

## 2014-07-18 LAB — PHOSPHORUS: Phosphorus: 4.4 mg/dL (ref 2.5–4.9)

## 2014-07-18 NOTE — Telephone Encounter (Signed)
emmi mailed  °

## 2014-07-19 LAB — CBC WITH DIFFERENTIAL/PLATELET
Basophil #: 0 10*3/uL (ref 0.0–0.1)
Basophil %: 0.1 %
EOS ABS: 0 10*3/uL (ref 0.0–0.7)
Eosinophil %: 0 %
HCT: 30.8 % — ABNORMAL LOW (ref 40.0–52.0)
HGB: 9.9 g/dL — AB (ref 13.0–18.0)
LYMPHS PCT: 5.1 %
Lymphocyte #: 1.1 10*3/uL (ref 1.0–3.6)
MCH: 30.6 pg (ref 26.0–34.0)
MCHC: 32.1 g/dL (ref 32.0–36.0)
MCV: 96 fL (ref 80–100)
MONO ABS: 0.9 x10 3/mm (ref 0.2–1.0)
Monocyte %: 4.1 %
NEUTROS PCT: 90.7 %
Neutrophil #: 20.3 10*3/uL — ABNORMAL HIGH (ref 1.4–6.5)
Platelet: 207 10*3/uL (ref 150–440)
RBC: 3.22 10*6/uL — ABNORMAL LOW (ref 4.40–5.90)
RDW: 14.8 % — ABNORMAL HIGH (ref 11.5–14.5)
WBC: 22.4 10*3/uL — ABNORMAL HIGH (ref 3.8–10.6)

## 2014-07-19 LAB — BASIC METABOLIC PANEL
Anion Gap: 6 — ABNORMAL LOW (ref 7–16)
BUN: 14 mg/dL (ref 7–18)
CO2: 26 mmol/L (ref 21–32)
Calcium, Total: 7.8 mg/dL — ABNORMAL LOW (ref 8.5–10.1)
Chloride: 107 mmol/L (ref 98–107)
Creatinine: 0.85 mg/dL (ref 0.60–1.30)
EGFR (African American): >60
EGFR (Non-African Amer.): >60
Glucose: 112 mg/dL — ABNORMAL HIGH (ref 65–99)
Osmolality: 279 (ref 275–301)
POTASSIUM: 4.2 mmol/L (ref 3.5–5.1)
Sodium: 139 mmol/L (ref 136–145)

## 2014-07-20 LAB — CBC WITH DIFFERENTIAL/PLATELET
Basophil #: 0 10*3/uL (ref 0.0–0.1)
Basophil %: 0.1 %
Eosinophil #: 0 10*3/uL (ref 0.0–0.7)
Eosinophil %: 0.3 %
HCT: 28.9 % — ABNORMAL LOW (ref 40.0–52.0)
HGB: 9.3 g/dL — ABNORMAL LOW (ref 13.0–18.0)
LYMPHS ABS: 1.1 10*3/uL (ref 1.0–3.6)
LYMPHS PCT: 9 %
MCH: 30.7 pg (ref 26.0–34.0)
MCHC: 32.2 g/dL (ref 32.0–36.0)
MCV: 96 fL (ref 80–100)
Monocyte #: 0.9 x10 3/mm (ref 0.2–1.0)
Monocyte %: 7.6 %
Neutrophil #: 9.8 10*3/uL — ABNORMAL HIGH (ref 1.4–6.5)
Neutrophil %: 83 %
Platelet: 184 10*3/uL (ref 150–440)
RBC: 3.02 10*6/uL — ABNORMAL LOW (ref 4.40–5.90)
RDW: 14.6 % — AB (ref 11.5–14.5)
WBC: 11.8 10*3/uL — ABNORMAL HIGH (ref 3.8–10.6)

## 2014-07-20 LAB — URINE CULTURE

## 2014-07-21 DIAGNOSIS — I2510 Atherosclerotic heart disease of native coronary artery without angina pectoris: Secondary | ICD-10-CM | POA: Diagnosis not present

## 2014-07-21 DIAGNOSIS — Z8673 Personal history of transient ischemic attack (TIA), and cerebral infarction without residual deficits: Secondary | ICD-10-CM | POA: Diagnosis not present

## 2014-07-21 DIAGNOSIS — A403 Sepsis due to Streptococcus pneumoniae: Secondary | ICD-10-CM | POA: Diagnosis not present

## 2014-07-21 DIAGNOSIS — J449 Chronic obstructive pulmonary disease, unspecified: Secondary | ICD-10-CM | POA: Diagnosis not present

## 2014-07-21 DIAGNOSIS — Z9981 Dependence on supplemental oxygen: Secondary | ICD-10-CM | POA: Diagnosis not present

## 2014-07-21 DIAGNOSIS — F0390 Unspecified dementia without behavioral disturbance: Secondary | ICD-10-CM | POA: Diagnosis not present

## 2014-07-21 DIAGNOSIS — I4891 Unspecified atrial fibrillation: Secondary | ICD-10-CM | POA: Diagnosis not present

## 2014-07-23 LAB — CULTURE, BLOOD (SINGLE)

## 2014-07-24 DIAGNOSIS — A403 Sepsis due to Streptococcus pneumoniae: Secondary | ICD-10-CM | POA: Diagnosis not present

## 2014-07-24 DIAGNOSIS — F0390 Unspecified dementia without behavioral disturbance: Secondary | ICD-10-CM | POA: Diagnosis not present

## 2014-07-24 DIAGNOSIS — J449 Chronic obstructive pulmonary disease, unspecified: Secondary | ICD-10-CM | POA: Diagnosis not present

## 2014-07-24 DIAGNOSIS — I4891 Unspecified atrial fibrillation: Secondary | ICD-10-CM | POA: Diagnosis not present

## 2014-07-24 DIAGNOSIS — I2510 Atherosclerotic heart disease of native coronary artery without angina pectoris: Secondary | ICD-10-CM | POA: Diagnosis not present

## 2014-07-24 DIAGNOSIS — Z8673 Personal history of transient ischemic attack (TIA), and cerebral infarction without residual deficits: Secondary | ICD-10-CM | POA: Diagnosis not present

## 2014-07-24 DIAGNOSIS — Z9981 Dependence on supplemental oxygen: Secondary | ICD-10-CM | POA: Diagnosis not present

## 2014-07-26 DIAGNOSIS — F0390 Unspecified dementia without behavioral disturbance: Secondary | ICD-10-CM | POA: Diagnosis not present

## 2014-07-26 DIAGNOSIS — I2510 Atherosclerotic heart disease of native coronary artery without angina pectoris: Secondary | ICD-10-CM | POA: Diagnosis not present

## 2014-07-26 DIAGNOSIS — J449 Chronic obstructive pulmonary disease, unspecified: Secondary | ICD-10-CM | POA: Diagnosis not present

## 2014-07-26 DIAGNOSIS — A403 Sepsis due to Streptococcus pneumoniae: Secondary | ICD-10-CM | POA: Diagnosis not present

## 2014-07-26 DIAGNOSIS — I4891 Unspecified atrial fibrillation: Secondary | ICD-10-CM | POA: Diagnosis not present

## 2014-07-26 DIAGNOSIS — Z8673 Personal history of transient ischemic attack (TIA), and cerebral infarction without residual deficits: Secondary | ICD-10-CM | POA: Diagnosis not present

## 2014-07-26 DIAGNOSIS — Z9981 Dependence on supplemental oxygen: Secondary | ICD-10-CM | POA: Diagnosis not present

## 2014-07-29 ENCOUNTER — Other Ambulatory Visit: Payer: Self-pay | Admitting: Internal Medicine

## 2014-07-30 DIAGNOSIS — J449 Chronic obstructive pulmonary disease, unspecified: Secondary | ICD-10-CM | POA: Diagnosis not present

## 2014-07-30 DIAGNOSIS — F0390 Unspecified dementia without behavioral disturbance: Secondary | ICD-10-CM | POA: Diagnosis not present

## 2014-07-30 DIAGNOSIS — Z8673 Personal history of transient ischemic attack (TIA), and cerebral infarction without residual deficits: Secondary | ICD-10-CM | POA: Diagnosis not present

## 2014-07-30 DIAGNOSIS — Z9981 Dependence on supplemental oxygen: Secondary | ICD-10-CM | POA: Diagnosis not present

## 2014-07-30 DIAGNOSIS — I2510 Atherosclerotic heart disease of native coronary artery without angina pectoris: Secondary | ICD-10-CM | POA: Diagnosis not present

## 2014-07-30 DIAGNOSIS — I4891 Unspecified atrial fibrillation: Secondary | ICD-10-CM | POA: Diagnosis not present

## 2014-07-30 DIAGNOSIS — A403 Sepsis due to Streptococcus pneumoniae: Secondary | ICD-10-CM | POA: Diagnosis not present

## 2014-08-01 ENCOUNTER — Encounter: Payer: Self-pay | Admitting: Internal Medicine

## 2014-08-01 ENCOUNTER — Ambulatory Visit (INDEPENDENT_AMBULATORY_CARE_PROVIDER_SITE_OTHER): Payer: Medicare Other | Admitting: Internal Medicine

## 2014-08-01 VITALS — BP 114/68 | HR 83 | Temp 97.0°F | Wt 140.0 lb

## 2014-08-01 DIAGNOSIS — I4819 Other persistent atrial fibrillation: Secondary | ICD-10-CM

## 2014-08-01 DIAGNOSIS — J441 Chronic obstructive pulmonary disease with (acute) exacerbation: Secondary | ICD-10-CM

## 2014-08-01 DIAGNOSIS — I481 Persistent atrial fibrillation: Secondary | ICD-10-CM

## 2014-08-01 DIAGNOSIS — J181 Lobar pneumonia, unspecified organism: Principal | ICD-10-CM

## 2014-08-01 DIAGNOSIS — J189 Pneumonia, unspecified organism: Secondary | ICD-10-CM | POA: Diagnosis not present

## 2014-08-01 MED ORDER — PREDNISONE 20 MG PO TABS: 40.0000 mg | ORAL_TABLET | Freq: Every day | ORAL | Status: DC

## 2014-08-01 NOTE — Progress Notes (Signed)
Subjective:    Patient ID: [name] [name], male    DOB: [name] [name] 22, 1922, 81 y.o.   MRN: [id number]  HPI Here for follow up of hospitalization at [facility] for LLL pneumonia and sepsis Here with granddaughter  Started with cough, dizzy spells and nausea Reviewed [facility] records In hospital 3 days Has been home now for 10 days--done with the antibiotic  Still has a bad cough---gets in throat and just "hangs" Tried robitussin Keeps him up at night--not sleeping well  No fever now Still with severe DOE--- still not back to baseline Now on 3l/min of oxygen Has home PT started--but won't be going much longer No night sweats or chills  Is wheezing ??prednisone in hospital--not clearly Using the proair 4 times per day---may help very briefly  Current Outpatient Prescriptions on File Prior to Visit  Medication Sig Dispense Refill  . ADVAIR DISKUS 250-50 MCG/DOSE AEPB INHALE 1 DOSE BY MOUTH TWICE DAILY. RINSE MOUTH AFTER USE 60 each 1  . aspirin 81 MG tablet Take 81 mg by mouth every other day.     . Cholecalciferol (D3-1000) 1000 UNITS capsule Take 1,000 Units by mouth daily.    [name] diltiazem (CARDIZEM) 120 MG tablet TAKE 1 TABLET BY MOUTH ONCE A DAY 30 tablet 0  . furosemide (LASIX) 20 MG tablet TAKE 1 TABLET BY MOUTH ONCE A DAY 90 tablet 3  . XARELTO 20 MG TABS tablet TAKE 1 TABLET BY MOUTH EVERY DAY WITH SUPPER 30 tablet 11   No current facility-administered medications on file prior to visit.    Allergies  Allergen Reactions  . No Known Allergies     Past Medical History  Diagnosis Date  . Cataract     2004- Cataract removal, 2007 Right eye cataract  . COPD (chronic obstructive pulmonary disease)   . Gastric ulcer     1977- Gastric Ulcers with hemorrhage and surgical repair  . Atrial fibrillation   . Hypertension   . Hyperlipidemia   . Stroke   . Pneumonia   . Fracture of femoral neck, right     Past Surgical History  Procedure Laterality Date  . Laceration repair     1983- laceration right wrist   . Carotid endarterectomy  214    right===Dr Dew  . Right hip hemiarthroplasty    . Fracture surgery Right 2014    hip    Family History  Problem Relation Age of Onset  . Stroke Mother   . Cancer Neg Hx     History   Social History  . Marital Status: Widowed    Spouse Name: N/A    Number of Children: 3  . Years of Education: N/A   Occupational History  . retired Scientist, product/process development    Social History Main Topics  . Smoking status: Former Smoker    Quit date: 03/07/2011  . Smokeless tobacco: Never Used  . Alcohol Use: Not on file  . Drug Use: Not on file  . Sexual Activity: Not on file   Other Topics Concern  . Not on file   Social History Narrative   Lives with oldest daughter   [name] [name] So should make decisions for him   Has DNR--reviewed and he still wants this         Review of Systems No delirium in hospital Appetite still not good---taking 3 ensure a day Some trouble swallowing pills since coming home. Soft diet otherwise    Objective:   Physical Exam  Constitutional: He appears well-developed. No  distress.  Neck: Normal range of motion. Neck supple. No thyromegaly present.  Cardiovascular: Normal rate and normal heart sounds.  Exam reveals no gallop.   No murmur heard. irregular  Pulmonary/Chest:  Decreased breath sounds throughout but worse in LLL Also has dullness at left base Coarse cough--sounds like bronchial secretions Mild increased expiratory phase and slight wheeze No crackles  Musculoskeletal: He exhibits no edema.  Lymphadenopathy:    He has no cervical adenopathy.          Assessment & Plan:

## 2014-08-01 NOTE — Patient Instructions (Signed)
Please start the prednisone today and use mucinex DM or robitussin DM for the cough.

## 2014-08-01 NOTE — Assessment & Plan Note (Signed)
Still with cough and PE findings of persistent effusion (which is not surprising) No clear persistence of infection but has the COPD which may be causing cough No more antibiotic for now Recheck in 1 month with CXR

## 2014-08-01 NOTE — Progress Notes (Signed)
Pre visit review using our clinic review tool, if applicable. No additional management support is needed unless otherwise documented below in the visit note. 

## 2014-08-01 NOTE — Assessment & Plan Note (Signed)
Rate is controlled No apparent CHF

## 2014-08-01 NOTE — Assessment & Plan Note (Signed)
May be the reason for the cough and increased DOE Will give burst of prednisone Robitussin or mucinex DM also

## 2014-08-02 ENCOUNTER — Encounter: Payer: Self-pay | Admitting: Internal Medicine

## 2014-08-02 DIAGNOSIS — A403 Sepsis due to Streptococcus pneumoniae: Secondary | ICD-10-CM | POA: Diagnosis not present

## 2014-08-02 DIAGNOSIS — I2510 Atherosclerotic heart disease of native coronary artery without angina pectoris: Secondary | ICD-10-CM | POA: Diagnosis not present

## 2014-08-02 DIAGNOSIS — Z8673 Personal history of transient ischemic attack (TIA), and cerebral infarction without residual deficits: Secondary | ICD-10-CM | POA: Diagnosis not present

## 2014-08-02 DIAGNOSIS — I4891 Unspecified atrial fibrillation: Secondary | ICD-10-CM | POA: Diagnosis not present

## 2014-08-02 DIAGNOSIS — J449 Chronic obstructive pulmonary disease, unspecified: Secondary | ICD-10-CM | POA: Diagnosis not present

## 2014-08-02 DIAGNOSIS — F0390 Unspecified dementia without behavioral disturbance: Secondary | ICD-10-CM | POA: Diagnosis not present

## 2014-08-02 DIAGNOSIS — Z9981 Dependence on supplemental oxygen: Secondary | ICD-10-CM | POA: Diagnosis not present

## 2014-08-06 DIAGNOSIS — A403 Sepsis due to Streptococcus pneumoniae: Secondary | ICD-10-CM | POA: Diagnosis not present

## 2014-08-06 DIAGNOSIS — J449 Chronic obstructive pulmonary disease, unspecified: Secondary | ICD-10-CM

## 2014-08-06 DIAGNOSIS — I4891 Unspecified atrial fibrillation: Secondary | ICD-10-CM | POA: Diagnosis not present

## 2014-08-06 DIAGNOSIS — I2510 Atherosclerotic heart disease of native coronary artery without angina pectoris: Secondary | ICD-10-CM

## 2014-08-14 DIAGNOSIS — J449 Chronic obstructive pulmonary disease, unspecified: Secondary | ICD-10-CM | POA: Diagnosis not present

## 2014-08-17 DIAGNOSIS — J449 Chronic obstructive pulmonary disease, unspecified: Secondary | ICD-10-CM | POA: Diagnosis not present

## 2014-08-17 DIAGNOSIS — I6529 Occlusion and stenosis of unspecified carotid artery: Secondary | ICD-10-CM | POA: Diagnosis not present

## 2014-08-17 DIAGNOSIS — I10 Essential (primary) hypertension: Secondary | ICD-10-CM | POA: Diagnosis not present

## 2014-08-17 DIAGNOSIS — E785 Hyperlipidemia, unspecified: Secondary | ICD-10-CM | POA: Diagnosis not present

## 2014-08-27 ENCOUNTER — Other Ambulatory Visit: Payer: Self-pay | Admitting: Internal Medicine

## 2014-08-29 ENCOUNTER — Other Ambulatory Visit: Payer: Self-pay | Admitting: Internal Medicine

## 2014-09-05 ENCOUNTER — Ambulatory Visit (INDEPENDENT_AMBULATORY_CARE_PROVIDER_SITE_OTHER): Payer: Medicare Other | Admitting: Internal Medicine

## 2014-09-05 ENCOUNTER — Telehealth: Payer: Self-pay

## 2014-09-05 ENCOUNTER — Encounter: Payer: Self-pay | Admitting: Internal Medicine

## 2014-09-05 ENCOUNTER — Ambulatory Visit (INDEPENDENT_AMBULATORY_CARE_PROVIDER_SITE_OTHER)
Admission: RE | Admit: 2014-09-05 | Discharge: 2014-09-05 | Disposition: A | Discharge status: Home / Self Care | Payer: Medicare Other | Source: Ambulatory Visit | Attending: Internal Medicine | Admitting: Internal Medicine

## 2014-09-05 VITALS — BP 100/60 | HR 85 | Wt 128.4 lb

## 2014-09-05 DIAGNOSIS — E440 Moderate protein-calorie malnutrition: Secondary | ICD-10-CM

## 2014-09-05 DIAGNOSIS — J449 Chronic obstructive pulmonary disease, unspecified: Secondary | ICD-10-CM

## 2014-09-05 DIAGNOSIS — J181 Lobar pneumonia, unspecified organism: Principal | ICD-10-CM

## 2014-09-05 DIAGNOSIS — J189 Pneumonia, unspecified organism: Secondary | ICD-10-CM | POA: Diagnosis not present

## 2014-09-05 DIAGNOSIS — R918 Other nonspecific abnormal finding of lung field: Secondary | ICD-10-CM | POA: Diagnosis not present

## 2014-09-05 DIAGNOSIS — I481 Persistent atrial fibrillation: Secondary | ICD-10-CM

## 2014-09-05 DIAGNOSIS — I4819 Other persistent atrial fibrillation: Secondary | ICD-10-CM

## 2014-09-05 MED ORDER — MIRTAZAPINE 15 MG PO TABS: 7.5000 mg | ORAL_TABLET | Freq: Every day | ORAL | Status: AC

## 2014-09-05 NOTE — Progress Notes (Signed)
Pre visit review using our clinic review tool, if applicable. No additional management support is needed unless otherwise documented below in the visit note. 

## 2014-09-05 NOTE — Assessment & Plan Note (Signed)
Back to baseline resp status

## 2014-09-05 NOTE — Assessment & Plan Note (Signed)
Clinically better but still decreased breath sounds there Will recheck CXR

## 2014-09-05 NOTE — Assessment & Plan Note (Signed)
Has lost 12# more!! Will start low dose mirtazapine

## 2014-09-05 NOTE — Assessment & Plan Note (Signed)
Good rate control On xarelto 

## 2014-09-05 NOTE — Progress Notes (Signed)
Subjective:    Patient ID: [name] [name], male    DOB: 01/03/1921, 81 y.o.   MRN: [id number]  HPI Here for follow of pneumonia Here with granddaughter  Feels better Only occasional cough Breathing is stable Some mucus in throat Wheezing is better  Eating fairly well and taking ensure --3 a day Weight is down 12# though  No palpitations No chest pain No dizziness or syncope  Current Outpatient Prescriptions on File Prior to Visit  Medication Sig Dispense Refill  . ADVAIR DISKUS 250-50 MCG/DOSE AEPB INHALE 1 DOSE BY MOUTH TWICE DAILY. RINSE MOUTH AFTER USE 60 each 1  . aspirin 81 MG tablet Take 81 mg by mouth every other day.     . Cholecalciferol (D3-1000) 1000 UNITS capsule Take 1,000 Units by mouth daily.    [name]. diltiazem (CARDIZEM) 120 MG tablet TAKE 1 TABLET BY MOUTH ONCE A DAY 30 tablet 10  . furosemide (LASIX) 20 MG tablet TAKE 1 TABLET BY MOUTH ONCE A DAY 90 tablet 3  . PROAIR HFA 108 (90 BASE) MCG/ACT inhaler   0  . XARELTO 20 MG TABS tablet TAKE 1 TABLET BY MOUTH EVERY DAY WITH SUPPER 30 tablet 11   No current facility-administered medications on file prior to visit.    Allergies  Allergen Reactions  . No Known Allergies     Past Medical History  Diagnosis Date  . Cataract     2004- Cataract removal, 2007 Right eye cataract  . COPD (chronic obstructive pulmonary disease)   . Gastric ulcer     1977- Gastric Ulcers with hemorrhage and surgical repair  . Atrial fibrillation   . Hypertension   . Hyperlipidemia   . Stroke   . Pneumonia   . Fracture of femoral neck, right     Past Surgical History  Procedure Laterality Date  . Laceration repair      1983- laceration right wrist   . Carotid endarterectomy  214    right===Dr Dew  . Right hip hemiarthroplasty    . Fracture surgery Right 2014    hip    Family History  Problem Relation Age of Onset  . Stroke Mother   . Cancer Neg Hx     History   Social History  . Marital Status: Widowed   Spouse Name: N/A  . Number of Children: 3  . Years of Education: N/A   Occupational History  . retired Scientist, product/process developmentTextile worker    Social History Main Topics  . Smoking status: Former Smoker    Quit date: 03/07/2011  . Smokeless tobacco: Never Used  . Alcohol Use: Not on file  . Drug Use: Not on file  . Sexual Activity: Not on file   Other Topics Concern  . Not on file   Social History Narrative   Lives with oldest daughter   [name] should make decisions for him   Has DNR--reviewed and he still wants this         Review of Systems Sleep is not that great. Up at night and then naps during the day Bowels are okay    Objective:   Physical Exam  Constitutional: No distress.  Neck: No thyromegaly present.  Cardiovascular: Normal rate.  Exam reveals no gallop.   No murmur heard. iregular  Pulmonary/Chest: Effort normal. No respiratory distress. He has no wheezes. He has no rales.  Still with decreased breath sounds--especially at left base. But clear   Musculoskeletal: He exhibits no edema or tenderness.  Lymphadenopathy:  He has no cervical adenopathy.  Psychiatric: He has a normal mood and affect. His behavior is normal.          Assessment & Plan:

## 2014-09-05 NOTE — Telephone Encounter (Signed)
Radiology called regarding patients chest xray, left base collapse/consolidation with scattered tarenchymal and plural nodules in left hemi thorax. Neoplastic process could have this appearance.  CT scan with contrast recommended to further evaluate.

## 2014-09-06 NOTE — Telephone Encounter (Signed)
Reviewed CXR  In view of his rapid weight loss and the findings--there is almost certainly cancer. A CT can confirm this but is really not clearly needed  Discussed with daughter She will try to talk to her dad about this---and the rest of her family I will plan a meeting when I get back to decide how to handle things   Lyla SonCarrie, Call her back and set up appt sometime the week I get back Okay to use same day if needed

## 2014-09-06 NOTE — Telephone Encounter (Signed)
I spoke with patient's daughter and she scheduled appointment on 09/17/14 at 4:15.

## 2014-09-09 ENCOUNTER — Emergency Department: Payer: Self-pay | Admitting: Emergency Medicine

## 2014-09-09 DIAGNOSIS — Z87891 Personal history of nicotine dependence: Secondary | ICD-10-CM | POA: Diagnosis not present

## 2014-09-09 DIAGNOSIS — H1132 Conjunctival hemorrhage, left eye: Secondary | ICD-10-CM | POA: Diagnosis not present

## 2014-09-12 DIAGNOSIS — J449 Chronic obstructive pulmonary disease, unspecified: Secondary | ICD-10-CM | POA: Diagnosis not present

## 2014-09-17 ENCOUNTER — Encounter: Payer: Self-pay | Admitting: Internal Medicine

## 2014-09-17 ENCOUNTER — Ambulatory Visit (INDEPENDENT_AMBULATORY_CARE_PROVIDER_SITE_OTHER): Payer: Medicare Other | Admitting: Internal Medicine

## 2014-09-17 VITALS — BP 122/68 | HR 68 | Temp 98.0°F | Wt 137.0 lb

## 2014-09-17 DIAGNOSIS — R918 Other nonspecific abnormal finding of lung field: Secondary | ICD-10-CM | POA: Diagnosis not present

## 2014-09-17 NOTE — Progress Notes (Signed)
Pre visit review using our clinic review tool, if applicable. No additional management support is needed unless otherwise documented below in the visit note. 

## 2014-09-17 NOTE — Progress Notes (Signed)
Subjective:    Patient ID: [name] [name], male    DOB: 12/11/1920, 81 y.o.   MRN: [id number]  HPI Here for review of abnormal CXR Son and daughter are here  They have discussed the fact that I was concerned about cancer In discussions, he feels he would not want RT or chemo  The family all rotate Lives with daughter [name] [name] Some one is with him all the time Appetite is better Weight is up 8# since last visit  Breathing is okay Does have intermittent tickle like cough---will cough once due to irritation in throat No sputum for the most part No fever  Current Outpatient Prescriptions on File Prior to Visit  Medication Sig Dispense Refill  . ADVAIR DISKUS 250-50 MCG/DOSE AEPB INHALE 1 DOSE BY MOUTH TWICE DAILY. RINSE MOUTH AFTER USE 60 each 1  . aspirin 81 MG tablet Take 81 mg by mouth every other day.     . Cholecalciferol (D3-1000) 1000 UNITS capsule Take 1,000 Units by mouth daily.    [name]. diltiazem (CARDIZEM) 120 MG tablet TAKE 1 TABLET BY MOUTH ONCE A DAY 30 tablet 10  . furosemide (LASIX) 20 MG tablet TAKE 1 TABLET BY MOUTH ONCE A DAY 90 tablet 3  . mirtazapine (REMERON) 15 MG tablet Take 0.5 tablets (7.5 mg total) by mouth at bedtime. 30 tablet 11  . PROAIR HFA 108 (90 BASE) MCG/ACT inhaler   0  . XARELTO 20 MG TABS tablet TAKE 1 TABLET BY MOUTH EVERY DAY WITH SUPPER 30 tablet 11   No current facility-administered medications on file prior to visit.    Allergies  Allergen Reactions  . No Known Allergies     Past Medical History  Diagnosis Date  . Cataract     2004- Cataract removal, 2007 Right eye cataract  . COPD (chronic obstructive pulmonary disease)   . Gastric ulcer     1977- Gastric Ulcers with hemorrhage and surgical repair  . Atrial fibrillation   . Hypertension   . Hyperlipidemia   . Stroke   . Pneumonia   . Fracture of femoral neck, right     Past Surgical History  Procedure Laterality Date  . Laceration repair      1983- laceration right wrist    . Carotid endarterectomy  214    right===Dr Dew  . Right hip hemiarthroplasty    . Fracture surgery Right 2014    hip    Family History  Problem Relation Age of Onset  . Stroke Mother   . Cancer Neg Hx     History   Social History  . Marital Status: Widowed    Spouse Name: N/A  . Number of Children: 3  . Years of Education: N/A   Occupational History  . retired Scientist, product/process developmentTextile worker    Social History Main Topics  . Smoking status: Former Smoker    Quit date: 03/07/2011  . Smokeless tobacco: Never Used  . Alcohol Use: No  . Drug Use: No  . Sexual Activity: Not on file   Other Topics Concern  . Not on file   Social History Narrative   Lives with oldest daughter   [name] [name] should make decisions for him   Has DNR--reviewed and he still wants this         Review of Systems Sleeps okay Satisfied with things    Objective:   Physical Exam  Constitutional: No distress.  Psychiatric: He has a normal mood and affect. His behavior is normal.  Assessment & Plan:

## 2014-09-17 NOTE — Assessment & Plan Note (Signed)
With LLL atelectasis which is almost certainly hiding a cancer Discussed with him the almost certain diagnosis of cancer He clearly does not want chemo or RT--and surgery is not an option given the multilobar involvement Has 24 hour care with family Has gained weight so functional status has improved Counseled and care planning for all of 25 minute visit (discussed symptom Rx and potentially hospice eventually)

## 2014-09-20 ENCOUNTER — Other Ambulatory Visit: Payer: Self-pay | Admitting: Internal Medicine

## 2014-09-27 ENCOUNTER — Ambulatory Visit (INDEPENDENT_AMBULATORY_CARE_PROVIDER_SITE_OTHER)
Admission: RE | Admit: 2014-09-27 | Discharge: 2014-09-27 | Disposition: A | Discharge status: Home / Self Care | Payer: Medicare Other | Source: Ambulatory Visit | Attending: Family Medicine | Admitting: Family Medicine

## 2014-09-27 ENCOUNTER — Ambulatory Visit (INDEPENDENT_AMBULATORY_CARE_PROVIDER_SITE_OTHER): Payer: Medicare Other | Admitting: Family Medicine

## 2014-09-27 ENCOUNTER — Other Ambulatory Visit: Payer: Self-pay | Admitting: *Deleted

## 2014-09-27 ENCOUNTER — Encounter: Payer: Self-pay | Admitting: Family Medicine

## 2014-09-27 VITALS — BP 118/60 | HR 83 | Temp 97.3°F | Wt 133.1 lb

## 2014-09-27 DIAGNOSIS — S32000A Wedge compression fracture of unspecified lumbar vertebra, initial encounter for closed fracture: Secondary | ICD-10-CM | POA: Insufficient documentation

## 2014-09-27 DIAGNOSIS — M5489 Other dorsalgia: Secondary | ICD-10-CM | POA: Diagnosis not present

## 2014-09-27 DIAGNOSIS — R1084 Generalized abdominal pain: Secondary | ICD-10-CM | POA: Diagnosis not present

## 2014-09-27 DIAGNOSIS — Z85118 Personal history of other malignant neoplasm of bronchus and lung: Secondary | ICD-10-CM | POA: Diagnosis not present

## 2014-09-27 DIAGNOSIS — M549 Dorsalgia, unspecified: Secondary | ICD-10-CM | POA: Diagnosis not present

## 2014-09-27 LAB — POCT URINALYSIS DIPSTICK
BILIRUBIN UA: NEGATIVE
Blood, UA: NEGATIVE
Glucose, UA: NEGATIVE
Ketones, UA: NEGATIVE
LEUKOCYTES UA: NEGATIVE
Nitrite, UA: NEGATIVE
PROTEIN UA: NEGATIVE
Spec Grav, UA: 1.025
Urobilinogen, UA: 0.2
pH, UA: 5.5

## 2014-09-27 MED ORDER — TRAMADOL HCL 50 MG PO TABS: 25.0000 mg | ORAL_TABLET | Freq: Two times a day (BID) | ORAL | Status: DC | PRN

## 2014-09-27 NOTE — Progress Notes (Signed)
BP 118/60 mmHg  Pulse 83  Temp(Src) 97.3 F (36.3 C)  Wt 133 lb 1.9 oz (60.383 kg)  SpO2 93%   CC: feeling ill  Subjective:    Patient ID: Shawan Gurganus, male    DOB: 1921-05-05, 81 y.o.   MRN: [id number]  HPI: Jakevion Rubenstein is a 81 y.o. male presenting on 09/27/2014 for Abdominal Pain; Back Pain; and Flank Pain   Presents today with 2d h/o L sided abdominal and lower and mid thoracic back pain most when getting out of bed or when coughing "I feel like i'm gonna bend in two". Continues coughing, increased wheezing, stable dyspnea but daughter has noticed increased need for oxygen. Currently 2.5 L O2 by [place].   No fevers/chills, nausea/vomiting, or diarrhea, or blood in stool.   Recent persistently abnormal L lung on CXR after LLL PNA, PCP concerned about multilobar lung cancer dx. Pt doesn't think would want RT or chemo.  Known COPD  Relevant past medical, surgical, family and social history reviewed and updated as indicated. Interim medical history since our last visit reviewed. Allergies and medications reviewed and updated. Current Outpatient Prescriptions on File Prior to Visit  Medication Sig  . ADVAIR DISKUS 250-50 MCG/DOSE AEPB INHALE 1 PUFF BY MOUTH 2 TIMES A DAY. **RINSE MOUTH AFTER EACH USE**  . aspirin 81 MG tablet Take 81 mg by mouth every other day.   . Cholecalciferol (D3-1000) 1000 UNITS capsule Take 1,000 Units by mouth daily.  [name] diltiazem (CARDIZEM) 120 MG tablet TAKE 1 TABLET BY MOUTH ONCE A DAY  . furosemide (LASIX) 20 MG tablet TAKE 1 TABLET BY MOUTH ONCE A DAY  . mirtazapine (REMERON) 15 MG tablet Take 0.5 tablets (7.5 mg total) by mouth at bedtime.  [name] PROAIR HFA 108 (90 BASE) MCG/ACT inhaler   . XARELTO 20 MG TABS tablet TAKE 1 TABLET BY MOUTH EVERY DAY WITH SUPPER   No current facility-administered medications on file prior to visit.    Review of Systems Per HPI unless specifically indicated above     Objective:    BP 118/60 mmHg  Pulse 83   Temp(Src) 97.3 F (36.3 C)  Wt 133 lb 1.9 oz (60.383 kg)  SpO2 93%  Wt Readings from Last 3 Encounters:  09/27/14 133 lb 1.9 oz (60.383 kg)  09/17/14 137 lb (62.143 kg)  09/05/14 128 lb 6.4 oz (58.242 kg)    Physical Exam  Constitutional: He appears well-developed and well-nourished. No distress.  HENT:  Mouth/Throat: Oropharynx is clear and moist. No oropharyngeal exudate.  Cardiovascular: Normal rate, normal heart sounds and intact distal pulses.  An irregular rhythm present.  No murmur heard. Pulmonary/Chest: Effort normal. No respiratory distress. He has decreased breath sounds (LLL). He has no wheezes. He has no rales.  Abdominal: Soft. Normal appearance and bowel sounds are normal. He exhibits no distension and no mass. There is no hepatosplenomegaly. There is no tenderness. There is no rigidity, no rebound, no guarding, no CVA tenderness and negative Murphy's sign.  Soft nontender abd exam  Musculoskeletal: He exhibits no edema.  Mild midline lumbar and thoracic back pain, mild paraspinous thoracic and lumbar spine pain.  Most noticeable with transitions  Skin: Skin is warm and dry. No rash noted.  Psychiatric: He has a normal mood and affect.  Nursing note and vitals reviewed.  Results for orders placed or performed in visit on 09/27/14  POCT Urinalysis Dipstick  Result Value Ref Range   Color, UA Yellow  Clarity, UA Clear    Glucose, UA Negative    Bilirubin, UA Negative    Ketones, UA Negative    Spec Grav, UA 1.025    Blood, UA Negative    pH, UA 5.5    Protein, UA Negative    Urobilinogen, UA 0.2    Nitrite, UA Negative    Leukocytes, UA Negative    CHEST 2 VIEW  COMPARISON: 08/23/2012.  FINDINGS: There is left base collapse/ consolidation with small left pleural effusion. Multiple small parenchymal and pleural nodules are seen in the left hemi thorax. There may be some minimal left-sided volume loss.  Right lung is clear. Imaged bony structures  of the thorax are intact.  IMPRESSION: Left base collapse/ consolidation with scattered parenchymal and pleural nodules in the left hemi thorax. The neoplastic process could have this appearance. CT scan with contrast is recommended to further evaluate.  These results will be called to the ordering clinician or representative by the Radiologist Assistant, and communication documented in the PACS or zVision Dashboard.   Electronically Signed  By: Kennith CenterEric Mansell M.D.  On: 09/05/2014 14:51    Assessment & Plan:   Problem List Items Addressed This Visit    Back pain - Primary    abd pain - possibly constipation. rec miralax daily (hold for diarrhea) + increased water intake. For back pain - in presumed diffuse lung cancer - concern for mets vs pathologic fracture. Check xrays of lumbar and thoracic spine. Treat pain with continued tylenol, may add tramadol prn breakthrough pain. Pt/daughter agree with plan.      Relevant Medications   traMADol (ULTRAM) tablet 50 mg   Other Relevant Orders   DG Lumbar Spine Complete   DG Thoracic Spine W/Swimmers    Other Visit Diagnoses    Generalized abdominal pain        Relevant Orders    POCT Urinalysis Dipstick (Completed)        Follow up plan: Return if symptoms worsen or fail to improve.

## 2014-09-27 NOTE — Progress Notes (Signed)
Pre visit review using our clinic review tool, if applicable. No additional management support is needed unless otherwise documented below in the visit note. 

## 2014-09-27 NOTE — Assessment & Plan Note (Signed)
abd pain - possibly constipation. rec miralax daily (hold for diarrhea) + increased water intake. For back pain - in presumed diffuse lung cancer - concern for mets vs pathologic fracture. Check xrays of lumbar and thoracic spine. Treat pain with continued tylenol, may add tramadol prn breakthrough pain. Pt/daughter agree with plan.

## 2014-09-27 NOTE — Patient Instructions (Addendum)
Let's get xrays of lower back and thoracic back. Make sure you're taking miralax 1 capful daily (ok to hold if diarrhea). Push lots of water. May use tramadol 1/2- 1 tablet as needed for breakthrough pain after tylenol. If not improving return next week to see Dr Alphonsus SiasLetvak.

## 2014-10-13 DIAGNOSIS — J449 Chronic obstructive pulmonary disease, unspecified: Secondary | ICD-10-CM | POA: Diagnosis not present

## 2014-10-23 NOTE — H&P (Signed)
PATIENT NAME:  [name] [name], [name] [name] MR#:  [id number] DATE OF BIRTH:  Aug 20, 1920  DATE OF ADMISSION:  06/28/2012  PRIMARY CARE PHYSICIAN: Dr. Alphonsus Sias.     CHIEF COMPLAINT: Left lower extremity weakness.   HISTORY OF PRESENTING ILLNESS: An 81 year old Caucasian male patient with history of atrial fibrillation on Coumadin, COPD, who stopped taking his Coumadin 4 days prior as his INR was elevated.  He presents to the Emergency Room complaining of acute onset of left lower extremity weakness. The patient was sitting on a recliner at home with family. Initially, he could not get his recliner down. Then the patient could not stand up. His family helped him up on his feet but noticed that his left leg was weak. He sat on the dining table, finished his lunch and then had problems again standing up. The patient was stood up but was dragging his left lower extremity and has been brought to the Emergency Room. Here the patient's symptoms seem to be improving, still has some mild residual weakness on the left lower extremity without any numbness, change in vision, shortness of breath or fever. He is being admitted to the hospitalist service for TIA.    CT of the head without contrast shows no acute intracranial abnormality. There are age-related atrophic changes diffusely. His INR is 1.   PAST MEDICAL HISTORY: 1.  COPD.  2.  Atrial fibrillation, on Coumadin.  3.  Dementia.  4.  Chronic respiratory failure, on 2 liters oxygen.  5.  Hypertension.  6.  Chronic constipation.   ALLERGIES: No known drug allergies.   SOCIAL HISTORY: The patient smoked 1 pack of cigarettes daily for 50 years but quit smoking. No alcohol, no illicit drugs. Lives at home with family.   CODE STATUS: DNR/DNI as mentioned by his healthcare power of attorney, daughter, sitting at bedside. He supposedly signed papers recently at his primary care physician's office.  I have requested the family bring the documents. The patient is DNR.     FAMILY HISTORY: Positive for father with kidney disease.   MEDICATIONS: 1.  Coumadin 5 mg oral every other day and in between 7.5 mg.  2.  Qvar 2 puffs inhaled twice a day.  3.  Lasix 20 mg oral daily.  4.  Flovent 2 puffs inhaled twice a day.  5.  Cardizem LA 120 mg oral daily.  6.  Metoprolol tartrate 50 mg oral twice a day.  7.  Spiriva 18 mcg inhaled daily.   REVIEW OF SYSTEMS: CONSTITUTIONAL:  No fatigue, weakness.  EYES: No blurred vision, double vision.  ENT: No tinnitus, ear pain.  RESPIRATORY: Positive for chronic cough with some sputum and shortness of breath on exertion.  CARDIOVASCULAR: History of A. fib. No chest pains.  GENITOURINARY: No dysuria or hematuria.  ENDOCRINE: No polyuria or nocturia.  HEMATOLOGIC: No anemia or easy bruising.  SKIN: No rash, lesions.  MUSCULOSKELETAL: No arthritis, muscle cramps.  NEUROLOGIC: No tingling, numbness. Has this left lower extremity weakness which is new.  PSYCHIATRIC: No anxiety or depression.   PHYSICAL EXAMINATION: VITAL SIGNS: Temperature 99.3, pulse 78, irregular; blood pressure 104/57, saturating 94% on 2 liters oxygen.  GENERAL: Elderly Caucasian male patient lying in bed, comfortable, conversational, cooperative with examination.  PSYCHIATRIC: Alert, oriented to person and place but not time. Mood and affect appropriate.  HEENT: Atraumatic, normocephalic. Oral mucosa moist and pink. External ears and nose normal. No pallor. No icterus. Pupils bilaterally equal and react to light. NECK: Supple. No thyromegaly. No  palpable lymph nodes. Trachea midline. No carotid bruit, JVD.  CARDIOVASCULAR: S1, S2 irregular without any murmurs. Peripheral pulses 2+. No edema.  RESPIRATORY: Bilateral wheezing and coarse crackles but not using any accessory muscles.  GASTROINTESTINAL: Soft abdomen, nontender. Bowel sounds present. No hepatosplenomegaly palpable.  SKIN: Warm and dry. No petechiae, rash, ulcers.  MUSCULOSKELETAL: No joint  swelling, redness, effusion of the large joints. Normal muscle tone.  NEUROLOGICAL: Motor strength 5- on the left lower extremity and 5 out of 5 in right lower extremity, upper extremity 5 out of 5. Sensation to fine touch intact all over. Cranial nerves II through XII are intact.  LABORATORY, DIAGNOSTIC AND RADIOLOGICAL DATA:  1.  Laboratory studies show glucose 131, creatinine 0.69, potassium 4. AST, ALT, alkaline phosphatase, bilirubin normal. WBC shows hemoglobin 12.2. INR 1.  2.  EKG shows atrial fibrillation.  3.  CT scan of the head shows no acute intracranial abnormality.   ASSESSMENT AND PLAN: 1.  Left lower extremity weakness which is improving at this time but still has some residual weakness. We will admit the patient for transient ischemic attack and rule out cerebrovascular accident. Will get an MRI of the brain along with 2-D echocardiogram and carotid Dopplers.  The patient is at high risk for transient ischemic attack and stroke considering his atrial fibrillation and presently INR being 1. The patient had elevated INR, was told to stop his Coumadin by his home nurse, but this was never restarted. Will restart his Coumadin at a lower dose of 6 instead of 7.5 and monitor. The patient will also be on Neuro checks and a telemetry floor.  2.  Atrial fibrillation, rate controlled. Continue the calcium channel blockers and beta blockers the patient is on. Resume Coumadin.  3.  Chronic respiratory failure with chronic obstructive pulmonary disease. The patient does have wheezing but this seems to be his baseline. We will continue breathing treatments and home inhalers. No steroids needed at this time.  4.  Hypertension, well controlled with medication. Continue home medications.  5.  CODE STATUS: DO NOT RESUSCITATE/DO NOT INTUBATE.  6.  Deep vein thrombosis prophylaxis with Lovenox.   TIME SPENT: Time spent today on this case was 45 minutes.    ____________________________ Molinda BailiffSrikar R.  Shyne Lehrke, MD srs:cs D: 06/28/2012 19:22:35 ET T: 06/28/2012 19:45:52 ET JOB#: [id number]  cc: Wardell HeathSrikar R. Elpidio AnisSudini, MD, <Dictator> Karie Schwalbeichard I. Letvak, MD Orie FishermanSRIKAR R Vala Raffo MD ELECTRONICALLY SIGNED 06/30/2012 14:05

## 2014-10-23 NOTE — Discharge Summary (Signed)
PATIENT NAME:  [name] [name], [name] [name] MR#:  [id number] DATE OF BIRTH:  [birth year]  DATE OF ADMISSION:  06/07/2012 DATE OF DISCHARGE:  06/13/2012  HISTORY AND PHYSICAL: For a detailed note, please take a look at the History and Physical done on admission by Dr. Delfino Lovett.   DISCHARGE DIAGNOSES:   1. Acute respiratory failure secondary to chronic obstructive pulmonary disease exacerbation.  2. Chronic obstructive pulmonary disease exacerbation.  3. Chronic atrial fibrillation.  4. Hypertension. 5. Constipation.   DIET: The patient is being discharged on a low-sodium diet.   ACTIVITY: As tolerated.   FOLLOWUP:  1. Follow up is with Dr. Tillman Abide in the next 1 to 2 weeks.  2. The patient is being set up for Home Health nursing and physical therapy services.   DISCHARGE MEDICATIONS:  1. Lasix 20 mg daily.  2. Warfarin 5 mg on Sunday, Monday, Wednesday and Friday, and warfarin 7.5 mg on Tuesday, Thursday, Saturday, although the patient is to hold his metformin for the next two days as his INR is supratherapeutic. 3. Metoprolol 50 mg b.i.d.  4. Spiriva 1 puff daily.  5. Advair 250/50 one puff b.i.d.  6. Cardizem CD 120 mg daily.  7. Levaquin 5 mg daily x7 days.  8. Prednisone taper starting at 60 mg, down to 10 mg over the next 12 days.   HOSPITAL CONSULTANTS: Dr. Freda Munro from [facility].   PERTINENT HOSPITAL LABORATORY, DIAGNOSTIC AND RADIOLOGICAL DATA:   A chest x-ray done on admission showing findings of chronic obstructive pulmonary disease but no acute cardiopulmonary disease. A blood culture  showed no evidence of any acute growth.   BRIEF HOSPITAL COURSE: This is an 81 year old male with multiple medical problems as mentioned above, presented to the hospital with shortness of breath and cough, and noted to be in acute respiratory failure secondary to chronic obstructive pulmonary disease exacerbation.   1. Acute respiratory failure: This is likely secondary to  chronic obstructive pulmonary disease exacerbation. He has a long history of tobacco abuse. He presented with severe shortness of breath, bronchospasm and wheezing. He was started on maximal therapy with IV steroids, around-the-clock nebulizer treatments, also started on Advair, Spiriva and also Pulmicort nebulizers. After receiving a few days of maximal therapy, the patient's clinical symptoms have significantly improved. He still has some minimal wheezing but has significantly improved since admission. He was ambulated on room air. He did desaturate to 87%, therefore qualified for home oxygen. Presently he is being discharged on a long prednisone taper along with Advair, Spiriva, and home oxygen as stated and empiric Levaquin for a few days.  2. Chronic obstructive pulmonary disease exacerbation: Again, this was likely secondary to ongoing tobacco abuse. The patient was strongly advised to quit smoking. He was treated aggressively with IV steroids, around-the-clock nebulizer treatments, Pulmicort nebulizers along with Advair and Spiriva. He did qualify for home oxygen; therefore, that was arranged for him by Home Health. Presently he is being discharged on maintenance Advair, Spiriva, a prednisone taper and empiric antibiotics, as stated.  3. Atrial fibrillation: Due to his respiratory illness, the patient had periods of severe tachyarrhythmias. He was not on any rate controlling medications prior to coming in. He was started on some p.o. Cardizem and beta blockers which seemed to control his rate better. He is, therefore, currently being discharged on the Cardizem and metoprolol as stated. He already is on Coumadin. His INR was supratherapeutic at 3.5 on discharge; therefore, he is being advised to hold  his Coumadin for the next two days. His PT, INR likely should be checked within the next two days and be reported to Dr. Alphonsus SiasLetvak to adjust his Coumadin as needed.  4. Hypertension: The patient remained  hemodynamically stable on his Cardizem and metoprolol. He will resume that upon discharge.   CODE STATUS: The patient is a FULL CODE.   DISPOSITION: He is being discharged with Home Health Nursing and PT services.   TIME SPENT WITH DISCHARGE: 40 minutes.   ____________________________ Rolly PancakeVivek J. Cherlynn KaiserSainani, MD vjs:cbb D: 06/13/2012 14:44:33 ET T: 06/13/2012 16:06:48 ET JOB#: [id number]  cc: Rolly PancakeVivek J. Cherlynn KaiserSainani, MD, <Dictator> Karie Schwalbeichard I. Letvak, MD Houston SirenVIVEK J Aundray Cartlidge MD ELECTRONICALLY SIGNED 06/30/2012 14:40

## 2014-10-23 NOTE — H&P (Signed)
PATIENT NAME:  [name] [name], RINDFLEISCH MR#:  [id number] DATE OF BIRTH:  [birth year]  DATE OF ADMISSION:  06/07/2012  PRIMARY CARE PHYSICIAN: Dr. [facility]man [facility]ide at [facility].  REQUESTING PHYSICIAN:  Dr. Margarita Grizzle  CHIEF COMPLAINT: Shortness of breath.   HISTORY OF PRESENT ILLNESS: The patient is an 81 year old male with a known history of chronic obstructive pulmonary disease and atrial fibrillation on Coumadin who is being admitted for acute respiratory failure likely due to chronic obstructive pulmonary disease exacerbation. The patient has been having shortness of breath for the last three to four weeks, has been getting worse despite taking his inhaler for the last 2 to 4 days. Also has associated cough with white-yellow sputum. Denies any fever, but also has chest pain with deep coughing. He also reports one of his daughters being sick around him and also has bad coughing. While in the ED he was given a rather long nebulizer breathing treatment but still continued wheezing. He was tachypneic with a rate of 26 and his heart rate was as fast as up to the 130s with a low-grade fever of 99.4.  He is being admitted for further evaluation and management.   PAST MEDICAL HISTORY:  1. Chronic obstructive pulmonary disease.  2. Atrial fibrillation, on Coumadin.   ALLERGIES: No known drug allergies.   SOCIAL HISTORY: Smokes 1 pack of cigarettes daily for the last 50 to 60 years. 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 No alcohol. No IV drugs of abuse.   FAMILY HISTORY: Positive for father with kidney disease.   MEDICATIONS AT HOME:  1. Warfarin 5 mg p.o. daily.  2. Qvar 2 puffs inhaled twice a day.  3. Lasix 20 mg p.o. daily.  4. Flovent 2 puffs inhaled twice a day.   REVIEW OF SYSTEMS: CONSTITUTIONAL: Positive for low-grade fever. No fatigue or weakness.  EYES: No blurred or double vision. ENT: No tinnitus or ear pain.  RESPIRATORY: Positive for cough with whitish-yellowish sputum, shortness of breath, and wheezing. CARDIOVASCULAR: History of atrial fibrillation, now tachycardic. No murmur, rubs, or gallop. Chest pain present with deep coughing. Shortness of breath also present. No lower extremity edema. GI: No nausea, vomiting, or diarrhea. GU: No dysuria or hematuria. ENDOCRINE: No polyuria or nocturia. HEME: No anemia or easy bruising. SKIN: No rash or lesion. MUSCULOSKELETAL: No arthritis or muscle cramps. NEUROLOGIC: No tingling, numbness, or weakness. PSYCHIATRIC: No history of anxiety or depression.   PHYSICAL EXAMINATION:  VITAL SIGNS: Temperature 99.4, heart rate 138 per minute, respirations 26 per minute, blood pressure 190/81 mmHg. He was saturating 91% on room air and has been placed on two liters of oxygen by nasal cannula and is saturating more than 95% now.  GENERAL: The patient is an 81 year old male lying in bed in acute respiratory distress.   EYES: Pupils equal, round, and reactive to light and accommodation. No scleral icterus. Extraocular muscles intact.   HENT: Head atraumatic, normocephalic. Oropharynx and nasopharynx clear.   NECK: Supple. No jugular venous distention. No thyroid enlargement or tenderness.   LUNGS: Decreased breath sounds at the bases bilaterally. Wheezing throughout both lungs. Uses some accessory muscles of respiration. No rales or rhonchi.   CARDIOVASCULAR: Irregularly irregular heart sounds. No murmurs, rubs, or gallop.   ABDOMEN: Soft, nontender, nondistended. Bowel sounds present. No organomegaly or masses.   EXTREMITIES: No pedal edema, cyanosis, or clubbing.   NEUROLOGIC: Nonfocal examination. Cranial nerves III through  XII intact. Muscle strength 5/5 in all extremities. Sensation intact.   PSYCHIATRIC: The patient is oriented to time, place, and person.   SKIN: No obvious rash, lesion, or ulcer.  LABORATORY, RADIOLOGICAL, AND DIAGNOSTIC DATA: Normal BMP  except sodium 135. Normal first set of cardiac enzymes. LFTs showed total bilirubin 1.3, alkaline phosphatase of 138, otherwise within normal limits. CBC within normal limits except white count of 12. PT 18, INR 1.5.   Chest x-ray while in the Emergency Department showed no acute cardiopulmonary disease, findings consistent with chronic obstructive pulmonary disease. EKG shows atrial fibrillation with rapid ventricular rate of 104 per minute. No major ST-T changes   IMPRESSION AND PLAN:  1. Acute respiratory failure with tachypnea, tachycardia, shortness of breath, wheezing, and accessory muscles of respiration in use likely due to chronic obstructive pulmonary disease exacerbation. We will start on nebulizer breathing treatment, IV steroids, antibiotics, Spiriva, and Flovent. We will obtain sputum culture. Blood culture has already been ordered in the Emergency Department.   2. Chronic obstructive pulmonary disease exacerbation. Management as above.  3. Rapid atrial fibrillation with a rate of 130 per minute. We will start him on IV Cardizem. His INR is 1.5. We will increase the Coumadin dose to 7.5 mg and monitor his coags every day.  4. Uncontrolled hypertension, likely due to difficulty breathing. We will change his Lasix to IV and add IV Cardizem. Monitor him on off-unit telemetry.  5. Tobacco abuse. He was counseled for about three minutes.  He is trying to quit, although not quite sure as he has being doing this for at least the last 50-60 years. He denies any need of nicotine replacement therapy while here in the hospital.   TOTAL TIME TAKING CARE OF THIS PATIENT (Critical Care): 55 minutes.    ____________________________ Ellamae SiaVipul S. Sherryll BurgerShah, MD vss:bjt D: 06/07/2012 20:39:53 ET T: 06/08/2012 06:34:21 ET JOB#: [id number]  cc: Menachem Urbanek S. Sherryll BurgerShah, MD, <Dictator> Karie Schwalbeichard I. Letvak, MD Ellamae SiaVIPUL S [facility] MD ELECTRONICALLY SIGNED 06/08/2012 22:55

## 2014-10-26 NOTE — H&P (Signed)
Subjective/Chief Complaint Right hip pain status post fall   History of Present Illness Patient was initially evaluated by me at 9 AM today.  Patient is a 81 y/o who sustained a fall at home while preparing to go out and play bingo.  He was unable to get up after falling on his right side.  He has history of stroke, COPD with home O2 and has rate controlled a.fib on Xarelto.  He took his Xarelto around 5 pm prior to his fall.  In the ER patient was diagnosed with a femoral neck hip fracture by xray.  His daughter is at the bedside with him today and he complains of right hip pain.  He has a superficial injury to the right elbow as well from the fall.  He denies other injuries.  Patient has had nausea and vomiting this AM.   Past Med/Surgical Hx:  CVA/Stroke:   carotid stent:   Pneumonia:   Asthma:   copd:   afib:   ALLERGIES:  No Known Allergies:   HOME MEDICATIONS: Medication Instructions Status  Cardizem LA 120 mg/24 hours oral tablet, extended release 1 tab(s) orally once a day at 7 am Active  Advair Diskus 250 mcg-50 mcg inhalation powder 1 puff(s) inhaled 2 times a day at 7 am and 7 pm Active  furosemide 20 mg oral tablet 1 tab(s) orally once a day at 7 am Active  Spiriva 18 mcg inhalation capsule 1 each inhaled once a day at 7 am Active  Xarelto 20 mg oral tablet 1 tab(s) orally once a day (in the evening) Active  Aspir 81 81 mg oral tablet 1 tab(s) orally once a day Active   Family and Social History:  Family History Non-Contributory   Place of Living Home  lives with his daughter's family   Review of Systems:  Subjective/Chief Complaint Right hip pain   Nausea/Vomiting Yes   Physical Exam:  GEN no acute distress   HEENT PERRL, hearing intact to voice, dry oral mucosa, Oropharynx clear, Hard of hearing, decreased vision   NECK supple  No masses  trachea midline   RESP normal resp effort  no use of accessory muscles  wheezing  rhonchi   CARD irregular rate  no  murmur  No LE edema  no JVD   ABD denies tenderness  soft  normal BS  no Adominal Mass   GU foley catheter in place  clear yellow urine draining   LYMPH negative neck   EXTR Right LE in Buck's traction and shortened and ER.  Skin intact.  Pedal pulses palpable bilaterally.  No obvious deformity.  No swelling, erythema or ecchymosis over the right hip.  Sensation is intact in both lower extremities.   SKIN normal to palpation, No ulcers   NEURO motor/sensory function intact   PSYCH alert   Lab Results: Routine BB:  23-Jul-14 22:32   ABO Group + Rh Type A Positive  Antibody Screen NEGATIVE (Result(s) reported on 25 Jan 2013 at 11:30PM.)  Crossmatch Unit 1 Ready  Crossmatch Unit 2 Ready (Result(s) reported on 25 Jan 2013 at 11:33PM.)  Cardiology:  23-Jul-14 20:45   Ventricular Rate 111  Atrial Rate 61  QRS Duration 78  QT 338  QTc 459  R Axis 81  T Axis 53  ECG interpretation Atrial fibrillation with rapid ventricular response with premature ventricular or aberrantly conducted complexes Abnormal ECG When compared with ECG of 28-Jun-2012 14:18, Vent. rate has increased BY  40 BPM ----------unconfirmed----------  Confirmed by OVERREAD, NOT ([id number]), editor PEARSON, BARBARA ([id number]) on 01/26/2013 10:06:23 AM  Routine Chem:  23-Jul-14 20:54   Glucose, Serum  109  BUN 11  Creatinine (comp) 0.85  Sodium, Serum 136  Potassium, Serum 4.4  Chloride, Serum 103  CO2, Serum 28  Calcium (Total), Serum 8.7  Anion Gap  5  Osmolality (calc) 272  eGFR (African American) >60  eGFR (Non-African American) >60 (eGFR values <5m/min/1.73 m2 may be an indication of chronic kidney disease (CKD). Calculated eGFR is useful in patients with stable renal function. The eGFR calculation will not be reliable in acutely ill patients when serum creatinine is changing rapidly. It is not useful in  patients on dialysis. The eGFR calculation may not be applicable to patients at the low and high extremes of  body sizes, pregnant women, and vegetarians.)  24-Jul-14 04:33   Glucose, Serum  108  BUN 12  Creatinine (comp) 0.80  Sodium, Serum 136  Potassium, Serum 4.7  Chloride, Serum 103  CO2, Serum 27  Calcium (Total), Serum  8.4  Anion Gap  6  Osmolality (calc) 272  eGFR (African American) >60  eGFR (Non-African American) >60 (eGFR values <62mmin/1.73 m2 may be an indication of chronic kidney disease (CKD). Calculated eGFR is useful in patients with stable renal function. The eGFR calculation will not be reliable in acutely ill patients when serum creatinine is changing rapidly. It is not useful in  patients on dialysis. The eGFR calculation may not be applicable to patients at the low and high extremes of body sizes, pregnant women, and vegetarians.)  Routine UA:  24-Jul-14 00:13   Color (UA) Yellow  Clarity (UA) Hazy  Glucose (UA) Negative  Bilirubin (UA) Negative  Ketones (UA) Negative  Specific Gravity (UA) 1.020  Blood (UA) 1+  pH (UA) 5.0  Protein (UA) 30 mg/dL  Nitrite (UA) Negative  Leukocyte Esterase (UA) Negative (Result(s) reported on 26 Jan 2013 at 01:05AM.)  RBC (UA) 19 /HPF  WBC (UA) 2 /HPF  Bacteria (UA) NONE SEEN  Epithelial Cells (UA) <1 /HPF  Mucous (UA) PRESENT (Result(s) reported on 26 Jan 2013 at 01:05AM.)  Routine Coag:  23-Jul-14 20:54   Prothrombin  17.8  INR 1.5 (INR reference interval applies to patients on anticoagulant therapy. A single INR therapeutic range for coumarins is not optimal for all indications; however, the suggested range for most indications is 2.0 - 3.0. Exceptions to the INR Reference Range may include: Prosthetic heart valves, acute myocardial infarction, prevention of myocardial infarction, and combinations of aspirin and anticoagulant. The need for a higher or lower target INR must be assessed individually. Reference: The Pharmacology and Management of the Vitamin K  antagonists: the seventh ACCP Conference on  Antithrombotic and Thrombolytic Therapy. [id number].[id number]:126 (3suppl): [id number] HCT value >55% may artifactually increase the PT.  In one study,  the increase was an average of 25%. Reference:  "Effect on Routine and Special Coagulation Testing Values of Citrate Anticoagulant Adjustment in Patients with High HCT Values." American Journal of Clinical Pathology 2006;126:400-405.)  Activated PTT (APTT) 33.7 (A HCT value >55% may artifactually increase the APTT. In one study, the increase was an average of 19%. Reference: "Effect on Routine and Special Coagulation Testing Values of Citrate Anticoagulant Adjustment in Patients with High HCT Values." American Journal of Clinical Pathology 2006;126:400-405.)  Routine Hem:  23-Jul-14 20:54   WBC (CBC)  18.8  RBC (CBC) 4.43  Hemoglobin (CBC) 14.0  Hematocrit (CBC) 41.4  Platelet Count (CBC)  272 (Result(s) reported on 25 Jan 2013 at 09:07PM.)  MCV 93  MCH 31.7  MCHC 34.0  RDW 13.2  24-Jul-14 04:33   WBC (CBC)  17.1  RBC (CBC)  3.95  Hemoglobin (CBC)  12.6  Hematocrit (CBC)  36.6  Platelet Count (CBC) 233  MCV 93  MCH 32.0  MCHC 34.5  RDW 13.2  Neutrophil % 89.9  Lymphocyte % 6.3  Monocyte % 3.7  Eosinophil % 0.0  Basophil % 0.1  Neutrophil #  15.4  Lymphocyte # 1.1  Monocyte # 0.6  Eosinophil # 0.0  Basophil # 0.0 (Result(s) reported on 26 Jan 2013 at 05:35AM.)   Radiology Results: XRay:    23-Jul-14 20:05, Femur Right  Femur Right  REASON FOR EXAM:    RIGHT LEG PAIN AFTER FALL  COMMENTS:       PROCEDURE: DXR - DXR FEMUR RIGHT  - Jan 25 2013  8:05PM     RESULT: The patient has sustained an acute subcapital fracture of the   right hip. The shaft of the femur appears intact. The observed portions   of the knee are normal in appearance. There is mild diffuse osteopenia.    IMPRESSION:  The patient has sustained an acute subcapital fracture of   the right hip. There is mild angulation at the fracture site.      Dictation Site: 5      Verified By: DAVID A. Martinique, M.D., MD    23-Jul-14 20:28, Chest 1 View AP or PA  Chest 1 View AP or PA  REASON FOR EXAM:    hip fracture  COMMENTS:       PROCEDURE: DXR - DXR CHEST 1 VIEWAP OR PA  - Jan 25 2013  8:28PM     RESULT: Comparison is made to study of June 28, 2012.    The lungs are adequately inflated. The interstitial markings are   increased diffusely. This is a marked change from the earlier study. The   cardiac silhouette is normal in size. There is mild prominence of the   central pulmonary vascularity. There is no significant pleural fluid   collection. The mediastinum is normal in width.    IMPRESSION:  Increased interstitial markings are present within both   lungs. This suggests subsegmental atelectasis or interstitial pneumonia     given is dramatic appearance since December 2013. Correlation with any   cardiopulmonary symptoms or history of same over the preceding 6 months   is needed.     Dictation Site: 5        Verified By: DAVID A. Martinique, M.D., MD    23-Jul-14 20:28, Elbow Right Complete  Elbow Right Complete  REASON FOR EXAM:    pain s/p fall  COMMENTS:       PROCEDURE: DXR - DXR ELBOW RT COMP W/OBLIQUES  - Jan 25 2013  8:28PM     RESULT: Four views of the right elbow reveal the bones to be mildly   osteopenic. The radial head is intact. The olecranon exhibits no acute   abnormality. The supracondylar and condylar portion of the distal humerus   is normal. No definite joint effusion is demonstrated.    IMPRESSION:  There is no acute bony abnormality of the right elbow.     Dictation Site: 5      Verified By: DAVID A. Martinique, M.D., MD    23-Jul-14 20:28, Pelvis AP Only  Pelvis AP Only  REASON FOR EXAM:    RIGHT HIP  FRACTURE  COMMENTS:       PROCEDURE: DXR - DXR PELVIS AP ONLY  - Jan 25 2013  8:28PM     RESULT: The bony pelvis is mildly osteopenic. There is no evidence of an   acute fracture. The SI  joints and sacrum are grossly normal for age.   There is an acute subcapital fracture of the right femur. The left   femoral head and neck appear intact.    IMPRESSION:  There is no acute bony abnormality of the pelvis.     Dictation Site: 5      Verified By: DAVID A. Martinique, M.D., MD    24-Jul-14 10:28, Chest Portable Single View  Chest Portable Single View  PRELIMINARY REPORT    The following is a PRELIMINARY Radiology report.  A final report will follow pending radiologist verification.      REASON FOR EXAM:    evaluate for pneumonia  COMMENTS:       PROCEDURE: DXR - DXR PORTABLE CHEST SINGLE VIEW  - Jan 26 2013 10:28AM     RESULT: Comparison is made to prior study dated 01/25/2013.    Findings: The patient has taken a shallow inspiration. There is blunting   of the left costophrenic angle. Linear areas of increased density project   within the right and left mid hemithoraces. There is thickening of the   interstitial markings. The cardiac silhouette is moderately enlarged. The   visualized bony skeleton is grossly unremarkable.    IMPRESSION:    1. Small effusion versus chronic scarring on the left.  2. Interstitial infiltrate. Differential considerations are pulmonary   edema versus inflammatory or infectious infiltrate. An underlying   component of pulmonary fibrosis is also of diagnostic consideration.       Thank you for the opportunity to contribute to the care of your patient.         Dictated By: Mikki Santee, M.D., MD  LabUnknown:    23-Jul-14 20:05, Femur Right  PACS Image    23-Jul-14 20:28, Chest 1 View AP or PA  PACS Image    23-Jul-14 20:28, Elbow Right Complete  PACS Image    23-Jul-14 20:28, Pelvis AP Only  PACS Image    24-Jul-14 10:28, Chest Portable Single View  PACS Image    Assessment/Admission Diagnosis Right displaced femoral neck hip fracture   Plan I have explained the diagnosis to the patient and his daughter.  I have  recommended a right hip hemiarthroplasty.  I explained the details of the procedure to them along with the risks and benefits.  The risks include, but are not limited to: infection, bleeding requiring transfusion, nerve and blood vessel injury (especially the sciatic nerve leading to foot drop), dislocation, fracture, leg length discrepancy, change in lower leg rotation, failure to return to independent ambulation, persistent hip pain and the need for more surgery including conversion to a total hip arthroplasty, DVT, and PE, MI, stroke, pneumonia, respiratory failure and death.  The patient is on Xarelto and therefore his surgery is delayed until tomorrow.  He had wheezing and rhonchi while auscultating his chest and I have ordered a repeat CXR.  He may be developing pneumonia and may require antibiotics.  I will defer to the hosptialist for this decision.  If he is found to have pneumonia, the family understands this will delay his hip surgery further.  Patient's CXR changes may be the result of chronic changes due to COPD.  I will discuss with  the radiologists.  I will rechceck labs in the AM.   Patient currently has a leukocytosis.  The risks and benefits of surgical intervention were discussed in detail with the patient and his daughter and they expressed understanding of the risks and benefits and agreed with plans for surgery.   Patient will be able to eat today.  He will be NPO after midnight.  Patient is written for anti-emetics and has been given these medications for nausea and vomiting this AM.   Electronic Signatures: Thornton Park (MD)  (Signed 24-Jul-14 13:15)  Authored: CHIEF COMPLAINT and HISTORY, PAST MEDICAL/SURGIAL HISTORY, ALLERGIES, HOME MEDICATIONS, FAMILY AND SOCIAL HISTORY, REVIEW OF SYSTEMS, PHYSICAL EXAM, LABS, Radiology, ASSESSMENT AND PLAN   Last Updated: 24-Jul-14 13:15 by Thornton Park (MD)

## 2014-10-26 NOTE — Consult Note (Signed)
PATIENT NAME:  [name] [name], [name]:  [id number] DATE OF BIRTH:  1921/02/05  DATE OF CONSULTATION:  01/26/2013  REFERRING PHYSICIAN:  Kathreen DevoidKevin L. Krasinski, MD CONSULTING PHYSICIAN:  Susa GriffinsPadmaja Canton Yearby, MD  PRIMARY CARE PHYSICIAN: Karie Schwalbeichard I. Letvak, MD  CHIEF COMPLAINT: Fall, right femur fracture.   HISTORY OF PRESENT ILLNESS: Mr. [name] [name] is a 81 year old pleasant white male with past medical history of COPD, oxygen dependent on 1 liter of oxygen, atrial fibrillation, on Xarelto, with chronic respiratory failure, who presented to the Emergency Department after having a fall. The patient lives by himself and was walking, lost balance in his legs and fell down to the right side. He started to experience severe pain. Workup in the Emergency Department reveals the patient has a right subcapital fracture The patient denied having any chest pain, loss of consciousness. The patient, at baseline, walks small distances. The patient recently started walking and has been increasing with the exercise. He usually denies having any chest pain, palpitations or shortness of breath. The patient did not have any cardiac workup done in the past, including stress test or left heart catheterization. However, does not experience any chest pain. The patient has history of carotid artery stenosis and underwent carotid endarterectomy.   PAST MEDICAL HISTORY:  1. COPD, oxygen dependent on 1 liter of oxygen.  2. Atrial fibrillation, on Xarelto. 3. Dementia.  4. Chronic respiratory failure, on 1 liter of oxygen.  5. Hypertension.  6. Chronic constipation.   ALLERGIES: No known drug allergies.   SOCIAL HISTORY: Smoked in the past 1 pack a day for 50 years. No history of alcohol or drug use. Lives at home.   CODE STATUS: The patient is DNR/DNI. Medical health care power of attorney is the patient's daughter.  FAMILY HISTORY: Positive for chronic kidney disease.   HOME MEDICATIONS:  1. Xarelto 20 mg once a day. 2.  Spiriva 18 mcg once a day. 3. Lasix 20 mg once a day.  4. Cardizem LA 120 mg once a day.  5. Aspirin 81 mg daily.  6. Advair Diskus 250/50 one puff 2 times a day.   REVIEW OF SYSTEMS:  CONSTITUTIONAL: Generalized weakness, fatigue.  EYES: No change in vision.  ENT: No change in hearing.  RESPIRATORY: Has occasional cough. No productive sputum.  CARDIOVASCULAR: No chest pain or palpitations.  GASTROINTESTINAL: No nausea, vomiting, abdominal pain.  GENITOURINARY: No dysuria or hematuria.   ENDOCRINE: No polyuria or polydipsia.  HEMATOLOGIC: No easy bruising or bleeding.  SKIN: No rash or lesions. Has small lacerations on the right elbow from fall.  MUSCULOSKELETAL: No joint pains and aches. Has chronic back pain.  NEUROLOGIC: No weakness or numbness in any part of the body.   PHYSICAL EXAMINATION:  GENERAL: This is a thin-built, frail-looking male lying down in the bed, not in distress.  VITAL SIGNS: Temperature 98.5, pulse 84, blood pressure 109/82, respiratory rate of 16, oxygen saturation is 94% on 2 liters of oxygen.  HEENT: Head normocephalic, atraumatic. Eyes: No sclerae icterus. Conjunctivae normal. Pupils equal and reactive to light. No pharyngeal erythema.  NECK: Supple. No lymphadenopathy. No JVD. No carotid bruit.  CHEST: Has no focal tenderness.  LUNGS: Bilaterally clear to auscultation.  HEART: S1 and S2 regular. No murmurs are heard.  ABDOMEN: Bowel sounds present. Soft, nontender, nondistended. No hepatosplenomegaly.  EXTREMITIES: No pedal edema. Pulses 2+ pulses 2+.  MUSCULOSKELETAL: Good range of motion.  SKIN: No rash or lesions.  NEUROLOGIC: The patient is alert, oriented to place, person  and time. Cranial nerves II through XII intact. No motor and sensory deficits.   LABORATORY DATA: BMP is completely within normal limits. CBC: WBC of 8, hemoglobin 14, platelet count of 272.   IMAGING:  1. X-ray of the pelvis: No acute bony abnormality of the pelvis. 2. X-ray  of the elbow: No acute fracture.  3. Chest x-ray, 1-view, portable: Increased interstitial markings are present within both lungs, suggestive of subsegmental atelectasis or interstitial pneumonia.  4. X-ray of the femur on the right side shows has sustained an acute subcapital fracture of the right hip, mild angulation of the fracture off the side.  ASSESSMENT AND PLAN: Mr. Comes is a 81 year old male who comes to the Emergency Department with a fall, sustained right femoral fracture.   1. Right femoral subcapital fracture. Keep the patient at bedrest until the surgery is done. The patient has history of atrial fibrillation, on Xarelto. Will hold his Xarelto now. The patient can have surgery after 24 hours. Looking into the patient's medical conditions, the patient has multiple risk factors, the patient's age, previous history of smoking, hypertension, no cardiac workup in the past, keeping him at a moderate to high risk for perioperative surgical complications. However, considering the patient's ambulatory status, will clear for surgery. Family understands the risks related to surgery. Will involve the physical therapy to ambulate at the earliest. Discontinue Foley catheter at the earliest. Continue with incentive spirometer. Pain management as needed.  2. Atrial fibrillation, currently rate controlled. Will hold the Xarelto. 3. Hypertension, currently well controlled.  4. Chronic obstructive pulmonary disease, currently no issues. Will continue with the home oxygen.  5. Leukocytosis, most likely reactive.  6. For next 24 hours, assume Xarelto should provide deep vein thrombosis prophylaxis. Will, after that, start the patient on Lovenox.   TIME SPENT: 45 minutes.   ____________________________ Susa Griffins, MD pv:OSi D: 01/26/2013 04:05:01 ET T: 01/26/2013 05:46:28 ET JOB#: [id number]  cc: Susa Griffins, MD, <Dictator> Karie Schwalbe, MD Kathreen Devoid, MD  Susa Griffins MD ELECTRONICALLY SIGNED 03/08/2013 21:41

## 2014-10-26 NOTE — Discharge Summary (Signed)
PATIENT NAME:  [name], [name] MR#:  [id number] DATE OF BIRTH:  06/08/Helene [id number]  DATE OF ADMISSION:  01/25/2013 DATE OF DISCHARGE:  02/01/2013  ADMITTING DIAGNOSIS: Right femoral neck hip fracture.   HISTORY OF PRESENT ILLNESS: [name] [name] is a 81 year old male who fell while going out of the house to play bingo. He lives with his daughter's family. He was unable to stand or ambulate following the injury. He has a history of a prior stroke and COPD and is home O2 dependent. He also has rate-controlled atrial fibrillation and was taking Xarelto. The patient was diagnosed with a femoral neck hip fracture on the right side at the [facility]. He was admitted to the orthopedic surgery service for further evaluation and management.   PAST MEDICAL HISTORY: Includes:  1.  CVA/stroke.  2.  History of carotid stent placement.  3.  History of pneumonia.  4.  Asthma.  5.  COPD. 6.  Atrial fibrillation.   ALLERGIES: No known drug allergies.   HOME MEDICATIONS:  Include: Cardizem LA 120 mcg 1 tablet p.o. daily, Advair Diskus 250 mcg/50 mcg inhaled powder 1 puff b.i.d., furosemide 20 mg daily, Spiriva 18 mcg inhalation capsule daily, Xarelto 20 mg daily in the evening and aspirin 81 mg daily.   HOSPITAL COURSE: The patient was admitted to the orthopedic surgery service. The patient was seen by the hospitalist service upon admission for clearance. The patient was noted to have respiratory congestion on admission and had a chest x-ray showing question of a possible infiltrate on the right side. He was started on Levaquin. Given his respiratory symptoms, the anesthesiologist felt that he was not a good candidate for general anesthesia with intubation. Therefore, we had to wait 3 days for the Xarelto to clear out of his system before the patient was a candidate for a spinal anesthetic per the anesthesia service. The patient had labs drawn daily and had a stable hematocrit and hemoglobin  while awaiting surgery. He was continued to be followed by the hospitalist service and remained on Levaquin prior to his surgery. His Xarelto was stopped upon his admission.   On 01/29/2013, the patient was cleared for surgery, and the Anesthesia service agreed to perform the spinal. The patient had an uncomplicated right hip hemiarthroplasty and returned to the orthopedic floor postop. Following the operation, the patient continued to be followed by the medical service. He remained on postoperative antibiotics for 24 hours. He had some nausea and vomiting postop and was changed from morphine to Dilaudid for his pain control. The patient was seen by Physical and Occupational Therapy following surgery, and they continued to follow him throughout his hospitalization. The patient was able to get up to a chair and to ambulate. He had his Foley catheter removed on postop day 2 and was able to void postop. He also had his dressing changed on postop day 2, and this was changed daily thereafter. The patient was seen by Physical Therapy and was on posterior precautions as an inpatient. He was restarted on his Xarelto on postop day 2. Given the patient's clinical improvement, he was prepared for discharge to rehab.   DISCHARGE INSTRUCTIONS:  1.  The patient will be discharged to a skilled nursing facility with instructions to continue physical and occupational therapy for lower extremity strengthening, hip range of motion and gait training. He will remain on posterior precautions.  2.  He should continue with his knee-high TED stockings until followup.  3.  He needs to  have heel precautions and have his heels elevated off the bed while lying in bed. 4.  He should be encouraged to continue cough and deep breathing. He will remain on his 1 liter nasal cannula which is his baseline home dose of O2.  5.  The patient may resume a regular diet.  6.  He will follow up in Dr. Samuel Germany office in 10 to 14 days.  7.  He  should have daily dressing changes to the right side until completely dry. The staples will be removed in the orthopedic office.  8.  The patient will continue his home medications, and in addition to his home medication he will be discharged on acetaminophen 500 mg 2 tablets every 6 hours around-the-clock, magnesium hydroxide 30 mL daily p.r.n. for constipation, ferrous sulfate 325 mg b.i.d. with meals, levofloxacin 500 mg 1 tablet daily for 10 days, dextromethorphan/guaifenesin 10 mg/100 mg per 5 mL oral liquid, 10 mL every 6 hours p.r.n. for cough, calcium with vitamin D 500 mg/200 international units 1 tablet b.i.d. with meals, Dulcolax 10 mg rectal suppository 1 suppository at bedtime p.r.n. for constipation, docusate calcium 240 mg oral caplet daily at bedtime and oxycodone 5 mg 1 tablet every 4 to 6 hours as needed p.r.n. for severe pain.   DISCHARGE DIAGNOSES: 1.  Status post right hip hemiarthroplasty.  2.  Right lower lobe pneumonia.  3.  Chronic obstructive pulmonary disease and asthma.   ____________________________ Kathreen Devoid, MD klk:cb D: 02/01/2013 15:43:30 ET T: 02/01/2013 16:09:25 ET JOB#: [id number]  cc: Kathreen Devoid, MD, <Dictator> Kathreen Devoid MD ELECTRONICALLY SIGNED 02/16/2013 16:55

## 2014-10-26 NOTE — Op Note (Signed)
PATIENT NAME:  [name] [name], [name] MR#:  [id number] DATE OF BIRTH:  19-Jan-1921  DATE OF PROCEDURE: 01/29/2013   PREOPERATIVE DIAGNOSIS: Right femoral neck hip fracture.   POSTOPERATIVE DIAGNOSIS: Right femoral neck hip fracture.   PROCEDURE: Right hip hemiarthroplasty.   SURGEON: Juanell FairlyKevin Rajvir Ernster, M.D.   ANESTHESIA: Spinal.   COMPLICATIONS: None.   ESTIMATED BLOOD LOSS: 200 mL.   SPECIMEN: Femoral head, to pathology.   IMPLANTS: Stryker Accolade 127-degree neck angle size 5.5 TMZF femoral stem, 58 mm Unitrax outer diameter head with a +12 mm offset neck adjustment sleeve.   INDICATIONS FOR THE PROCEDURE: The patient is a 81 year old male who sustained a fall while going out to play bingo. He was unable to stand or ambulate after his injury. He was brought to the [facility] where he was diagnosed with a femoral neck hip fracture by x-ray. The patient was admitted to the orthopedic surgery service for further evaluation and management. Given the fact that the patient was an independent ambulator prior to surgery I recommended to the patient and his family that he undergo a right hip hemiarthroplasty. This will allow for early ambulation and allow him to get out of bed.  I reviewed the risks and benefits of surgery with the patient and his family. They understood that the risks include, but are not limited to: Infection, requiring the removal of the prosthesis, bleeding requiring blood transfusion, nerve or blood vessel injury, especially injury to the sciatic nerve leading to foot-drop  or dorsal foot numbness; the foot-drop may be permanent and require use of an AFO brace, fracture, dislocation, leg length discrepancy, change in lower extremity rotation, persistent right hip pain, failure to return to ambulation, and the need for further surgery.   Medical complications include, but are not limited to: DVT and pulmonary embolism, myocardial infarction, stroke, pneumonia,  respiratory failure, and death. They understood these risks and wished to proceed.   The patient's surgery was delayed from the time of his admission because he was on Xarelto, a blood-thinning medication for a previous stroke. He required 3 days off of this medication before the anesthesiologist would give him a spinal. He also has baseline COPD, on 1 liter oxygen at home and therefore was not a good candidate for general endotracheal intubation, per the anesthesiologist. The patient had also been started empirically on Levaquin for some chest congestion to treat possible developing pneumonia. His white count had normalized and he was afebrile on the date of surgery.   PROCEDURE NOTE: The patient was marked with the word "yes" according to the hospital's right-site protocol. He was brought to the operating room where he underwent a spinal anesthetic by the anesthesia service. The patient was then positioned in a left lateral decubitus position. All bony prominences were adequately padded, including an axillary roll under his left side and adequate padding to protect the left common peroneal nerve during surgery. He was prepped and draped in a sterile fashion. A time-out was performed to verify the patient's name, date of birth, medical record number, correct site of surgery, and correct procedure to be performed. It was also used to verify the patient had received antibiotics and that all appropriate instruments, implants, and radiographic studies were available in the room. Once all in attendance were in agreement, the case began.   A #10 blade was used to make a curvilinear incision centered over the greater trochanter. Subcutaneous tissues were dissected using electrocautery. All bleeding vessels were cauterized during exposure. The  fascia lata was then identified and cleared of overlying subcutaneous tissue using a Cobb elevator. The fascia lata was then sharply incised with a deep #10 blade, revealing  underlying hip bursa. This was resected, and the underlying external rotators were visualized. The piriformis and conjoined tendon were then released from the posterior greater trochanter. These were tagged for later repair and reflected posteriorly to protect the sciatic nerve. The underlying capsule was then identified. A T-shaped capsulotomy was then performed using electrocautery. Both leaflets of the capsule were tagged with a #2 Tycron for later repair. The femoral head was then removed using a corkscrew device and measured to be 58 mm in diameter.   Attention was then turned to the proximal femoral osteotomy. A proximal femoral hip skid along with a Cobra retractor was used to expose the proximal femur. A template device was used to confirm the angle of the proximal femoral osteotomy. An oscillating saw was used to make this osteotomy approximately 1 fingerbreadth above the lesser trochanter. The cobra retractors were then placed around the acetabulum. The 58 mm femoral head trial was placed into the socket and found to have excellent fit.   Next, the attention was turned back to the femur. The femoral hip skid was then placed under the femoral neck and again, a Cobra retractor was placed medially to protect the soft tissues. A box osteotome was used to make the initial entry into the proximal femoral canal. A single hand reamer was then placed into the femoral canal, and a canal finder was used to confirm that no penetration of the femoral cortex had occurred during hand reaming. Broaches were then inserted in the proximal femur at approximately 15 degrees of anteversion. The broaches were then used  in sequence starting with a size 0 and going up by half-sizes. The best medial and lateral femoral canal fit was found to be a size 5.5. The trial 127-degree neck angle, along with a 58 mm outer diameter head was trialed, along with a +0, +4, +8, and +12 offset neck length.   The patient was found to have  the best stability and range of motion, and equivalent leg lengths with a +12 offset. The hip was then taken through a full range of motion and found to have excellent stability and no laxity. The trial components were then removed. The hip joint was copiously irrigated. The actual Stryker Accolade TMZF 5.5 mm stem was then inserted in the femoral canal. Again, it was trialed with a 58 mm outer shell and a +12 neck offset. This was reduced and again taken through a full range of motion. It had excellent stability and equivalent leg lengths. The trial head was then removed. The actual 50 mm Unitrax endoprosthesis head component along with the +12 adjustment sleeve was then malleted gently into position on the femoral trunnion. The whole construct was then reduced. Again, it was taken through a full range of motion and found to have full range of motion and excellent stability and equivalent leg lengths. The hip joint was then copiously irrigated. The posterior capsule underwent repair with #2 Tycron. With the +12 offset repair of the external rotators it was not possible without extreme tension on the repair. It appeared that this may place too much compression on the sciatic nerve and therefore the external rotators were not repaired back.   The fascia lata was then closed with interrupted 0 Vicryl suture. The subcutaneous tissues were closed with 2-0 Vicryl and the skin  approximated with staples. A dry sterile dressing was applied. The bandage on his right elbow, which sustained a superficial abrasion during his fall, was checked. The skin appeared to be healthy, with healthy granulation tissue. There was no erythema, swelling or drainage from the wounds. Xeroform, 4 x 4's, and Tegaderm were placed over this wound. The patient was then placed on his back. His leg lengths were equivalent on the surgical table. An abduction pillow was placed between his legs to protect against dislocation.   He was then carefully  transferred to a hospital bed and brought to the PACU in stable condition. I spoke with the patient's family in the postop waiting room to let them know the case had gone without complication. The patient was stable in the recovery room. All sharp and instrument counts were correct at the conclusion of the case.   I was scrubbed and present for the entire case.    ____________________________ Kathreen Devoid, MD klk:dm D: 01/29/2013 11:08:26 ET T: 01/29/2013 11:27:09 ET JOB#: [id number]  cc: Kathreen Devoid, MD, <Dictator> Kathreen Devoid MD ELECTRONICALLY SIGNED 02/01/2013 15:46

## 2014-10-26 NOTE — Consult Note (Signed)
PATIENT NAME:  [name] [name], Clayson MR#:  [id number] DATE OF BIRTH:  06-Sep-1920  DATE OF CONSULTATION:  05/04/2013  REFERRING PHYSICIAN:  Emergency Room  CONSULTING PHYSICIAN:  Zackery BarefootJ. Madison Kimmie Doren, MD  HISTORY OF PRESENT ILLNESS: The patient is a 81 year old gentleman who lives at home, is independent and highly functioning, but has very poor hearing and according to his family "does not feel pain normally." He was using Q-tips at about 2:00 p.m. yesterday and did not experience any pain, but woke up this morning with some bleeding from the left ear. He notably is on Xarelto and is still slightly actively bleeding from the left ear.   ALLERGIES: No known drug allergies.  MEDICATIONS:  1.  Advair. 2.  Aspirin 81 mg. 3.  Diltiazem. 4.  Furosemide. 5.  Spiriva. 6.  Vitamin C. 7.  Vitamin D3. 8.  Xarelto 20 mg once a day.   PAST MEDICAL HISTORY:  1.  CVA.  2.  COPD. 3.  Tobacco abuse.  4.  Pneumonia. 5.  Asthma.  6.  Atrial fibrillation.  REVIEW OF SYSTEMS: No dizziness. No tinnitus. Positive for poor hearing in both ears. No change since yesterday.   PHYSICAL EXAMINATION: GENERAL: Frail, elderly male, very pleasant, cooperative. Diffuse ecchymosis on the forearms. HEAD AND FACE: Normocephalic, atraumatic. Diffuse remote actinic damage with multiple seborrheic keratoses. No active lesions in need of biopsy.  NOSE: The septum is deviated to the right, very small nasal airway on the right. Left side is patent.  ORAL CAVITY AND OROPHARYNX: Upper denture in place. No masses or lesions intraorally. No bleeding in the posterior oropharynx.  NECK: Trachea is midline. No thyromegaly or thyroid nodules. No cervical adenopathy.  EARS: There is an abrasion in the posterior external auditory canal. There is some cerumen deep in the canal obscuring visualization of the tympanic membrane. On the left, there is maceration with active bleeding from the anterior canal wall admixed with small amount of cerumen  or skin debris. Tympanic membrane is recently abraded. No active bleeding from the tympanic membrane. However, the ossicles are difficult to visualize secondary to the abrasion and active bleeding anteriorly.   PROCEDURE: Placement of Oto-Wick with Ciprodex. Under magnification the Oto-Wick was atraumatically placed and Ciprodex was placed, 4 drops to inflate the Oto-Wick.  IMPRESSION:  1.  Left-sided otologic trauma from Q-tip use with continued bleeding secondary to Xarelto. I have recommended that the Oto-Wick be left in place until Monday and I will remove it in my clinic on Monday. If it falls out during the weekend, I advised the patient's family that that is not a problem, but while it is in there I would like him to use antibiotic drops.  2.  I have advised the patient to avoid putting anything in his ear smaller than his elbow. ____________________________ Shela CommonsJ. Gertie BaronMadison Doreen Garretson, MD jmc:sb D: 05/04/2013 13:19:59 ET T: 05/04/2013 13:32:30 ET JOB#: [id number]  cc: Zackery BarefootJ. Madison Skarlet Lyons, MD, <Dictator> [facility] - Practice Administrator Wendee CoppJMADISON Marelyn Rouser MD ELECTRONICALLY SIGNED 05/05/2013 21:48

## 2014-10-26 NOTE — Discharge Summary (Signed)
PATIENT NAME:  [name] [name], Cornelis MR#:  [id number] DATE OF BIRTH:  05-11-1921  DATE OF ADMISSION:  08/10/2012 DATE OF DISCHARGE:  08/13/2012  ADMITTING AND DISCHARGE DIAGNOSES:  1.  Right carotid endarterectomy.  2.  Postoperative hypotension requiring pressors after endarterectomy.   PROCEDURE PERFORMED IN THE HOSPITAL: Right carotid endarterectomy. For full details of that, please see the dictated operative summary.   BRIEF HISTORY: This is an 81 year old white male who had a transient ischemic event to the right hemisphere and was found to have a near occlusive right carotid artery stenosis. He is brought in for right carotid endarterectomy.   HOSPITAL COURSE: The patient taken to the operative room where a right carotid endarterectomy was performed. He did well from this initially, but required pressors. He was neurologically intact and had no other obvious complications. On postoperative day #1 and 2, he was still on pressors. By the evening of postoperative day #2, he was able to be weaned off of dopamine, but it was not until late that evening and he was kept overnight for observation to ensure that he would be safe and remain normotensive. It was at this point, he was deemed stable for discharge. He was discharged home accompanied by his family. His diet is regular. His activity is as tolerated. He will be discharged home accompanied by his family. He will resume all his previous home medications and will be taking Plavix and Norco as needed for pain. He will return to our office to 2 to 4 weeks with follow-up with ultrasound. He will call or contact us with problems in the interim.    ____________________________ Annice NeedyJason S. Odetta Forness, MD jsd:aw D: 08/24/2012 15:37:42 ET T: 08/24/2012 15:48:07 ET JOB#: [id number]  cc: Annice NeedyJason S. Sun Kihn, MD, <Dictator> Annice NeedyJASON S Jametta Moorehead MD ELECTRONICALLY SIGNED 08/29/2012 9:39

## 2014-10-26 NOTE — Op Note (Signed)
PATIENT NAME:  [name] [name], Nickolai MR#:  [id number] DATE OF BIRTH:  01-26-1921  DATE OF PROCEDURE:  08/10/2012  PREOPERATIVE DIAGNOSES: 1. High-grade right carotid artery stenosis with transient ischemic attack approximately 1 month ago.  2. Chronic obstructive pulmonary disease.   POSTOPERATIVE DIAGNOSES:  1. High-grade right carotid artery stenosis with transient ischemic attack approximately 1 month ago.  2. Chronic obstructive pulmonary disease.   PROCEDURE PERFORMED: Right carotid endarterectomy.   SURGEON: Annice NeedyJason S. Dew, M.D.   ANESTHESIA: General.   ESTIMATED BLOOD LOSS: 50 mL.  INDICATION FOR PROCEDURE: An 81 year old white male with nearly occlusive right carotid artery stenosis. He had an episode of left-sided weakness about 4 to 6 weeks ago. This resolved in less than 24 hours and did not leave residual deficits. His carotid disease was found as part of that workup. He is brought in today for surgical repair of his carotid artery. Risks and benefits were discussed and informed consent was obtained.   DESCRIPTION OF PROCEDURE: The patient is brought to the operative suite and after an adequate level of general anesthesia was obtained he was placed in modified beach chair position, a roll was placed under his shoulder and his head was flexed and turned to the side. After appropriate surgical timeout and intravenous antibiotics, the incision made along the anterior border of the sternocleidomastoid and we dissected down through the platysma with electrocautery and Weitlaner retractor was used to help facilitate our exposure. The jugular vein was identified. The facial vein was small but this was ligated and divided between silk ties as were a couple of other venous branches. He had a very low bifurcation of his carotid artery and the common carotid artery was initially dissected out and encircled with vessel loops. We then dissected out the external carotid artery and superior thyroid artery  and in the internal carotid artery well distal to the lesion. The patient was systemically heparinized. Control was pulled up on the vessel loops after this had circulated for 5 minutes. An anterior wall arteriotomy was created with an 11 blade and extended with Potts scissors. The lesion was a focal type lesion in the proximal internal carotid artery. The artery distal to this was a reasonably nice artery and he had a large common carotid artery. I elected to perform a primary repair. Endarterectomy was performed in the usual fashion with the [employer] elevator. The proximal endpoint was cut flush with tenotomy scissors and a nice feathered endpoint was created at the distal endpoint and was tacked down with two 7-0 Prolene sutures. The external carotid artery had an eversion endarterectomy performed. All loose flecks were removed and the vessel was locally heparinized. I then started my suture line at the distal endpoint with a 6-0 Prolene suture. This was run to the midportion of the arteriotomy. The second 6-0 Prolene suture was started at the proximal endpoint and run approximately one quarter the length of the arteriotomy. The [place] shunt was then removed. This had been placed at the time of clamping. The vessel was flushed and deaired and locally heparinized. I then completed the arteriotomy flushing several cardiac cycles through the external carotid artery and a single 6-0 Prolene patch suture was used for hemostasis. On release of control, there was a nice pulse within the internal carotid artery and Surgicel was placed. The wound was closed with 3 interrupted 3-0 Vicryl sutures, in the sternocleidomastoid space, the platysma was closed with a running 3-0 Vicryl and the skin was closed with 4-0 Monocryl.  Sterile dressing was placed. The patient tolerated the procedure well and was taken to the recovery room in stable condition. ____________________________ Annice Needy, MD jsd:sb D: 08/11/2012  11:07:00 ET T: 08/11/2012 12:57:53 ET JOB#: [id number]  cc: Annice Needy, MD, <Dictator> Karie Schwalbe, MD Annice Needy MD ELECTRONICALLY SIGNED 08/22/2012 16:56

## 2014-10-26 NOTE — Discharge Summary (Signed)
PATIENT NAME:  [name] [name], [name] [name] MR#:  [id number] DATE OF BIRTH:  Dec 11, 1920  DATE OF ADMISSION:  06/28/2012 DATE OF DISCHARGE:  06/30/2012  PRIMARY CARE PHYSICIAN: Dr. Alphonsus SiasLetvak.   DISCHARGE DIAGNOSES:  1. Right periventricular white matter multiple foci of acute infarcts.  2. Atrial fibrillation, on Coumadin.  3. Hypertension.  4. Chronic respiratory failure due to chronic obstructive pulmonary disease, on 2 liters home oxygen.   IMAGING STUDIES DONE: Include an MRI of the brain which showed multiple foci of infarcts in the right periventricular matter.   Carotid Dopplers show 90% stenosis of proximal right internal carotid artery.   A 2-D echocardiogram shows ejection greater than 55% and mildly dilated left atrium. No PFO. No clots.   ADMITTING HISTORY AND PHYSICAL: Please see detailed H and P dictated on 06/28/2012. In brief, an 81 year old Caucasian male patient with history of atrial fibrillation on Coumadin, COPD, who stopped taking his Coumadin 4 days prior to admission, presented to the Emergency Room complaining of acute onset of left lower extremity weakness. The patient's symptoms were improving by the time of admission but was admitted for high risk of stroke for MRI, echo, carotid Dopplers, further workup, treatment and physical therapy.   HOSPITAL COURSE:  1. Acute CVA: The patient did have acute multiple infarcts in the right periventricular matter which was thought likely secondary to atrial fibrillation. He does have 90% stenosis of his internal carotid artery for which he will follow up with vascular surgery as an outpatient.  2. Atrial fibrillation with rapid ventricular rate: The patient's INR level was 1 at the time of admission. His Coumadin was stopped secondary to elevated INR but in the hospital has been restarted, and the patient will continue his Coumadin. Follow up with primary care physician to keep his INR between 2 to 3. The patient is high risk of further strokes  with his increased CHADS score and needs to have INR level maintained between 2 and 3. His rate was well controlled during the hospital stay.  3. Hypertension, chronic obstructive pulmonary disease with chronic respiratory failure was stable.   On the day of discharge, the patient had a blood pressure of 123/69. Pulse was 70, in atrial fibrillation. Saturating 97% on 2 liters oxygen, with improving left lower extremity weakness and is being discharged home with home health physical therapy.   DISCHARGE MEDICATIONS: Include: 1. Coumadin 5 mg oral once a day.  2. Cardizem LA 120 mg oral once a day.  3. Advair Diskus 250/50 twice a day.  4. Lasix 20 mg oral once a day.  5. Spiriva 18 mcg inhaled once a day.  6. Metoprolol tartrate 50 mg 1/2 tablet oral twice a day.  7. Crestor 20 mg oral once a day.   DISCHARGE INSTRUCTIONS: Continue with home health physical therapy. The patient will be on a cardiac, low-cholesterol diet. Activity as tolerated with assistance. Follow up with vascular surgery as an outpatient.   Time spent on this discharge dictation along with coordinating and counseling of the patient was 40 minutes.     ____________________________ Molinda BailiffSrikar R. Juliza Machnik, MD srs:gb D: 06/30/2012 13:34:40 ET T: 06/30/2012 22:07:05 ET JOB#: [id number]  cc: Wardell HeathSrikar R. Elpidio AnisSudini, MD, <Dictator> Karie Schwalbeichard I. Letvak, MD Orie FishermanSRIKAR R Amilya Haver MD ELECTRONICALLY SIGNED 07/20/2012 23:42

## 2014-10-30 ENCOUNTER — Other Ambulatory Visit: Payer: Self-pay | Admitting: Internal Medicine

## 2014-11-04 NOTE — H&P (Signed)
PATIENT NAME:  [name] [name], MASE MR#:  [id number] DATE OF BIRTH:  1921/05/01  DATE OF ADMISSION:  07/18/2014  REFERRING EMERGENCY ROOM PHYSICIAN: Coolidge Breeze, MD   PRIMARY CARE PHYSICIAN: Karie Schwalbe, MD   CHIEF COMPLAINT: Vomiting, body aches, chills, and fever.   HISTORY OF PRESENT ILLNESS: This very pleasant 81 year old man with past medical history of COPD, cerebrovascular disease, coronary artery disease, and atrial fibrillation presents today with several hours of vomiting and rigors. The history is provided by the patient and also by his grandson who lives with him. They report that the patient has been in his normal state of health until lunchtime on the day of admission when he had a decreased appetite and he did not want to eat. Shortly afterwards at about 4:00 p.m., he acutely began vomiting, had multiple episodes of nonbloody emesis. He also began shaking uncontrollably and wheezing. Prior to this had he had a slight cough no sputum production. He is on 2 L of nasal cannula at home. When EMS arrived, his oxygen was not on, his oxygen saturation was in the 80s, and improved to the 90s with placement of 2 L nasal cannula. On presentation to the Emergency Room, he is found to be tachycardic with a leukocytosis and fever of 100.2. He is being admitted for sepsis, likely due to pneumonia. Hospitalist services are asked to admit.   PAST MEDICAL HISTORY:  1.  Atrial fibrillation.  2.  COPD on 2 L of nasal cannula at home.  3.  Chronic respiratory failure with hypoxia due to COPD.  4.  History of CVA/stroke with no residual defect.  5.  History of coronary artery disease, status post PCI.  6.  History of carotid artery stenosis, status post carotid endarterectomy.  7.  Dementia.   PAST SURGICAL HISTORY: Carotid endarterectomy.   SOCIAL HISTORY: The patient lives with multiple family members including his daughter and granddaughter and his granddaughter's husband, who seems to be  his primary caretaker. He is a former smoker, quit 2 years ago, and has greater than 50 pack-year history of smoking. He occasionally drinks alcohol, possibly up to 3 beers a week. He is on 2 L of nasal cannula at home.   FAMILY MEDICAL HISTORY: Positive for coronary artery disease in multiple members. No family history of stroke.   REVIEW OF SYSTEMS:  CONSTITUTIONAL: Positive for fevers and chills and weakness. Negative for weight change or pain.  HEENT: No change in hearing or vision. No pain in the eyes or ears. No sore throat or difficulty swallowing.  RESPIRATORY: Positive for history of COPD on chronic oxygen, positive for wheezing, mild cough. No sputum production. No hemoptysis.  CARDIOVASCULAR: No chest pain, palpitations, syncope, orthopnea, edema, paroxysmal nocturnal dyspnea.  GASTROINTESTINAL: Positive as above for nausea and vomiting. No diarrhea or abdominal pain. No hematemesis, melena, or hematochezia.  GENITOURINARY: No dysuria or frequency.  HEMATOLOGIC: No easy bruising or bleeding.  SKIN: No new rashes, lesions, or abrasions.  MUSCULOSKELETAL: No new pain in the neck, back, hips, knees, or ankles. No swollen, tender joints. No gout.  NEUROLOGIC: No seizure, headache. A history of stroke, as mentioned in the chart in 2003. History of dementia, as noted in the chart, but the patient seems fairly alert and oriented at this time. No focal numbness or weakness.  PSYCHIATRIC: No history of schizophrenia or bipolar disorder.   ALLERGIES: No known allergies.   HOME MEDICATIONS:  1.  Xarelto 20 mg 1 tablet once a  day in the evening.  2.  Vitamin D3 at 1000 international units 1 tablet daily.  3.  Furosemide 20 mg 1 tablet once a day.  4.  Diltiazem 120 mg 1 tablet once a day.  5.  Aspirin 81 mg 1 tablet daily.  6.  Advair Diskus 250 mcg/50 mcg 1 puff inhaled twice a day.   PHYSICAL EXAMINATION:  VITAL SIGNS: Temperature 98.8, pulse 100, respirations 20, blood pressure 117/77,  oxygenation 96% on 3 L nasal cannula.  GENERAL: No acute distress, patient very thin and frail-appearing.  HEENT: The right eye is hazy, left pupil is reactive. Conjunctivae are clear. Extraocular motion intact. Oral mucous membranes are dry. The patient is edentulous with upper dentures in place. Posterior oropharynx clear. No exudate, erythema, or edema.  NECK: Supple. No cervical lymphadenopathy. Thyroid nontender.  RESPIRATORY: He has diffuse scattered wheezes and crackles at the left base. Good air movement.  CARDIOVASCULAR: Irregular, rate controlled. No murmurs, rubs, or gallops. No peripheral edema. Peripheral pulses are 1+.  ABDOMEN: Soft, nontender, nondistended. No guarding, rebound. No splenomegaly. Bowel sounds are normal.  MUSCULOSKELETAL: No joint effusions. Strength is 5/5 throughout. Tone is normal.  NEUROLOGIC: Cranial nerves II-XII grossly intact. Strength and sensation intact  SKIN: No rashes, open lesions, or sores.  PSYCHIATRIC: The patient is alert and oriented x 4 with good insight into his clinical condition. No signs of uncontrolled anxiety or depression.   IMAGING: Chest x-ray shows left lower lobe pneumonia with small left pleural effusion; recommend follow-up radiography in 4-6 weeks to demonstrate resolution. If there is not complete resolution CT of the chest would be warranted.   LABORATORY DATA: Sodium 139, potassium 4.7, chloride 103, bicarbonate 26, BUN 16, creatinine 1.12, glucose 175, total protein 7.9, albumin 2.8, bilirubin 0.3, alkaline phosphatase 130, AST is 23, ALT is 13. Troponin is less than 0.02. White blood cells 20.5, hemoglobin 12.2, platelets 348,000, MCV is 98, INR is 1.6. Urinalysis is negative for signs of infection.   ASSESSMENT AND PLAN:  1.  Sepsis: The patient is tachycardic, has leukocytosis and fever with pneumonia. He meets sepsis criteria. Blood cultures and sputum cultures are pending. We will start Levaquin. He has already received about  2 L of normal saline in the Emergency Room per the sepsis protocol. We will continue on 125 mL/hour of normal saline.  2.  Pneumonia: At this point, his oxygenation at baseline with greater than 90% on 2-3 L of nasal cannula. Treating with Levaquin.  3.  Atrial fibrillation: Rate is controlled. Continue diltiazem and Xarelto.  4.  Chronic obstructive pulmonary disease: He does have some diffuse wheezes but does not seem to have a frank chronic obstructive pulmonary disease exacerbation at this time. Continue with inhalers, provide nebulizers q. 6 hours and continue Levaquin. I will hold off on steroids for now. 5.  History of carotid disease and stroke: Continue aspirin. He is not currently on a statin medication and I will not start one at this time.  6.  Prophylaxis: He is on Xarelto. No further deep vein thrombosis prophylaxis needed. No gastrointestinal prophylaxis at this time.   CODE STATUS:  DNR. This was discussed with the patient at admission in the presence of his  granddaughter and her husband. The patient reports that he does not want cardiopulmonary resuscitation or intubation. This order has been placed in the chart.   TIME SPENT ON ADMISSION: 45 minutes.     ____________________________ Ena Dawley. Clent Ridges, MD cpw:bm D: 07/19/2014 00:48:56  ET T: 07/19/2014 01:36:54 ET JOB#: [id number]  cc: Santina Evansatherine P. Clent RidgesWalsh, MD, <Dictator> Gale JourneyATHERINE P WALSH MD ELECTRONICALLY SIGNED 07/26/2014 18:44

## 2014-11-04 NOTE — Discharge Summary (Signed)
PATIENT NAME:  [name] [name], Cagney MR#:  [id number] DATE OF BIRTH:  05-25-1921  DISCHARGE DIAGNOSES: 1.  Sepsis with left lower lobe pneumonia.  2.  Chronic atrial fibrillation. 3.  Chronic obstructive pulmonary disease.  PRIMARY CARE PHYSICIAN: Karie Schwalbeichard I. Letvak, MD   DISCHARGE MEDICATIONS: Advair Diskus 250/50 one puff b.i.d., furosemide 20 mg daily, Xarelto 20 mg p.o. daily, aspirin 81 mg daily, vitamin D 1000 units once a day, diltiazem 120 mg p.o. daily, Ensure Plus 1 can p.o. t.i.d., Levaquin 500 mg p.o. daily. Note that Levaquin is written as 250, but for community-acquired pneumonia I  told patient that he can take 500 mg daily, so we wrote the prescription to pharmacy for Levaquin 500 mg daily.   CONSULTATIONS: Physical therapy.   HOSPITAL COURSE:  The patient is a 81 year old male with history of COPD, chronic atrial fibrillation, brought in because of cough, fever, and vomiting. The patient admitted for sepsis due to pneumonia. The patient had chills and also fever. Oxygen saturations were 80% in the EMS and improved to 90% on 2 liters and temperature 100.4 in the ER. The patient also was tachycardic in the ER.  He was admitted for sepsis due to pneumonia.   Chest x-ray showed left lower lobe pneumonia with pleural effusion. The patient's white count was 20.5 on admission. He received Levaquin, continued oxygen, and also fluids were given. The patient was given nebulizers for bronchodilation. Following, the patient improved nicely. White count also decreased and patient's white count almost decreased to 11.8 on January 15.   The patient improved nicely and he had negative blood cultures. Initial lactic acid was 3.1. Next one was normal. The patient's urine cultures were negative.  Sepsis improved and we discharged him home with Levaquin and he could take his Advair. The patient was seen by physical therapy. They recommended home physical therapy. The patient did receive home physical therapy  followup.   PHYSICAL EXAMINATION:  VITALS: Temperature 97.6, heart rate 96, blood pressure 104/62. Saturation 98% on 2 liters.  CARDIOVASCULAR SYSTEM:  S1, S2, regular.  LUNGS: Clear to auscultation. No wheeze noted.  ABDOMEN: Soft, nontender, nondistended. Bowel sounds present.  NEUROLOGICAL: Alert, awake, and oriented. No focal neurological deficit.   The patient was continued on Levaquin, I gave him rescue inhaler, albuterol for wheezing and Lasix was held on admission because of sepsis and dehydration, but restarted the Lasix and he has chronic atrial fibrillation. He is on Xarelto for his atrial fibrillation and CVA history.  Will continue that and Lasix with controlled heart rate at 96.   Primary doctor is Dr. Tillman Abideichard Letvak, and patient can follow up with him.   The patient went home in stable condition. TIME SPENT: More than 30 minutes.   ____________________________ Katha HammingSnehalatha Nianna Igo, MD sk:LT D: 07/23/2014 18:11:53 ET T: 07/23/2014 20:37:44 ET JOB#: [id number]  cc: Katha HammingSnehalatha Jerrit Horen, MD, <Dictator> Katha HammingSNEHALATHA Kyrian Stage MD ELECTRONICALLY SIGNED 08/07/2014 12:51

## 2014-11-07 ENCOUNTER — Ambulatory Visit (INDEPENDENT_AMBULATORY_CARE_PROVIDER_SITE_OTHER): Payer: Medicare Other | Admitting: Internal Medicine

## 2014-11-07 ENCOUNTER — Encounter: Payer: Self-pay | Admitting: Internal Medicine

## 2014-11-07 VITALS — BP 130/60 | HR 76 | Temp 98.1°F | Wt 136.0 lb

## 2014-11-07 DIAGNOSIS — S32000S Wedge compression fracture of unspecified lumbar vertebra, sequela: Secondary | ICD-10-CM | POA: Diagnosis not present

## 2014-11-07 DIAGNOSIS — R918 Other nonspecific abnormal finding of lung field: Secondary | ICD-10-CM

## 2014-11-07 DIAGNOSIS — I480 Paroxysmal atrial fibrillation: Secondary | ICD-10-CM

## 2014-11-07 DIAGNOSIS — E441 Mild protein-calorie malnutrition: Secondary | ICD-10-CM

## 2014-11-07 DIAGNOSIS — J449 Chronic obstructive pulmonary disease, unspecified: Secondary | ICD-10-CM

## 2014-11-07 MED ORDER — TRAMADOL HCL 50 MG PO TABS: 50.0000 mg | ORAL_TABLET | Freq: Three times a day (TID) | ORAL | Status: DC | PRN

## 2014-11-07 NOTE — Assessment & Plan Note (Signed)
Seems regular On xarelto

## 2014-11-07 NOTE — Progress Notes (Signed)
Subjective:    Patient ID: [name] [name], male    DOB: 10/16/1920, 81 y.o.   MRN: [id number]  HPI Here for follow up of multiple issues Here with daughter  Back pain is some better Tramadol helps but he is out now  Appetite is improved Has gained some additional weight  Occasional cough Hears mucus all the time---doesn't bring up anything most of the time Breathing is okay Walks with rolling walker Needs help with shower and dressing Does use bathroom himself  No chest pain No palpitaitons No dizziness or syncope No edema  Current Outpatient Prescriptions on File Prior to Visit  Medication Sig Dispense Refill  . ADVAIR DISKUS 250-50 MCG/DOSE AEPB INHALE 1 PUFF BY MOUTH 2 TIMES A DAY. **RINSE MOUTH AFTER EACH USE** 60 each 1  . aspirin 81 MG tablet Take 81 mg by mouth every other day.     . Cholecalciferol (D3-1000) 1000 UNITS capsule Take 1,000 Units by mouth daily.    [name]. diltiazem (CARDIZEM) 120 MG tablet TAKE 1 TABLET BY MOUTH ONCE A DAY 30 tablet 10  . furosemide (LASIX) 20 MG tablet TAKE 1 TABLET BY MOUTH ONCE A DAY 90 tablet 3  . mirtazapine (REMERON) 15 MG tablet Take 0.5 tablets (7.5 mg total) by mouth at bedtime. 30 tablet 11  . traMADol (ULTRAM) 50 MG tablet Take 0.5-1 tablets (25-50 mg total) by mouth 2 (two) times daily as needed. 40 tablet 0  . XARELTO 20 MG TABS tablet TAKE 1 TABLET BY MOUTH EVERY DAY WITH SUPPER 30 tablet 11   No current facility-administered medications on file prior to visit.    Allergies  Allergen Reactions  . No Known Allergies     Past Medical History  Diagnosis Date  . Cataract     2004- Cataract removal, 2007 Right eye cataract  . COPD (chronic obstructive pulmonary disease)   . Gastric ulcer     1977- Gastric Ulcers with hemorrhage and surgical repair  . Atrial fibrillation   . Hypertension   . Hyperlipidemia   . Stroke   . Pneumonia   . Fracture of femoral neck, right     Past Surgical History  Procedure Laterality  Date  . Laceration repair      1983- laceration right wrist   . Carotid endarterectomy  214    right===Dr Dew  . Right hip hemiarthroplasty    . Fracture surgery Right 2014    hip    Family History  Problem Relation Age of Onset  . Stroke Mother   . Cancer Neg Hx     History   Social History  . Marital Status: Widowed    Spouse Name: N/A  . Number of Children: 3  . Years of Education: N/A   Occupational History  . retired Scientist, product/process developmentTextile worker    Social History Main Topics  . Smoking status: Former Smoker    Quit date: 03/07/2011  . Smokeless tobacco: Never Used  . Alcohol Use: No  . Drug Use: No  . Sexual Activity: Not on file   Other Topics Concern  . Not on file   Social History Narrative   Lives with oldest daughter   [name] [name] should make decisions for him   Has DNR--reviewed and he still wants this         Review of Systems Sleeping fair Bowels are slow--discussed that this could be from the pain med    Objective:   Physical Exam  Constitutional: No distress.  Neck: Normal range of motion. Neck supple. No thyromegaly present.  Cardiovascular: Normal rate and regular rhythm.  Exam reveals no gallop.   No murmur heard. Pulmonary/Chest: Effort normal. No respiratory distress. He has no wheezes. He has no rales.  Decreased breath sound throughout--but very little air movement in left base  Abdominal: Soft. There is no tenderness.  Lymphadenopathy:    He has no cervical adenopathy.  Psychiatric: He has a normal mood and affect. His behavior is normal.          Assessment & Plan:

## 2014-11-07 NOTE — Progress Notes (Signed)
Pre visit review using our clinic review tool, if applicable. No additional management support is needed unless otherwise documented below in the visit note. 

## 2014-11-07 NOTE — Assessment & Plan Note (Signed)
Multiple small fractures in lumbar and thoracic spine Some DJD as well Tramadol helps Start miralax

## 2014-11-07 NOTE — Assessment & Plan Note (Signed)
Breathing is stable No changes needed 

## 2014-11-07 NOTE — Assessment & Plan Note (Signed)
Still with LLL atelectasis on exam Functional status and weight up---not clearly cancer Observation only

## 2014-11-07 NOTE — Assessment & Plan Note (Signed)
Has gained weight Continue mirtazapine

## 2014-11-12 DIAGNOSIS — J449 Chronic obstructive pulmonary disease, unspecified: Secondary | ICD-10-CM | POA: Diagnosis not present

## 2014-11-19 ENCOUNTER — Other Ambulatory Visit: Payer: Self-pay | Admitting: Internal Medicine

## 2014-11-21 ENCOUNTER — Other Ambulatory Visit: Payer: Self-pay | Admitting: Internal Medicine

## 2014-12-12 DIAGNOSIS — H179 Unspecified corneal scar and opacity: Secondary | ICD-10-CM | POA: Diagnosis not present

## 2014-12-13 DIAGNOSIS — J449 Chronic obstructive pulmonary disease, unspecified: Secondary | ICD-10-CM | POA: Diagnosis not present

## 2014-12-20 DIAGNOSIS — H2102 Hyphema, left eye: Secondary | ICD-10-CM | POA: Diagnosis not present

## 2014-12-26 DIAGNOSIS — H2102 Hyphema, left eye: Secondary | ICD-10-CM | POA: Diagnosis not present

## 2014-12-31 DIAGNOSIS — H2102 Hyphema, left eye: Secondary | ICD-10-CM | POA: Diagnosis not present

## 2015-01-02 DIAGNOSIS — H2102 Hyphema, left eye: Secondary | ICD-10-CM | POA: Diagnosis not present

## 2015-01-11 DIAGNOSIS — H2101 Hyphema, right eye: Secondary | ICD-10-CM | POA: Diagnosis not present

## 2015-01-12 DIAGNOSIS — J449 Chronic obstructive pulmonary disease, unspecified: Secondary | ICD-10-CM | POA: Diagnosis not present

## 2015-01-24 ENCOUNTER — Inpatient Hospital Stay
Admission: EM | Admit: 2015-01-24 | Discharge: 2015-01-26 | DRG: 189 | Disposition: A | Discharge status: Home Health | Payer: Medicare Other | Attending: Internal Medicine | Admitting: Internal Medicine

## 2015-01-24 ENCOUNTER — Encounter: Payer: Self-pay | Admitting: Emergency Medicine

## 2015-01-24 ENCOUNTER — Emergency Department: Payer: Medicare Other

## 2015-01-24 DIAGNOSIS — J209 Acute bronchitis, unspecified: Secondary | ICD-10-CM | POA: Diagnosis not present

## 2015-01-24 DIAGNOSIS — I482 Chronic atrial fibrillation: Secondary | ICD-10-CM | POA: Diagnosis not present

## 2015-01-24 DIAGNOSIS — J9621 Acute and chronic respiratory failure with hypoxia: Secondary | ICD-10-CM | POA: Diagnosis not present

## 2015-01-24 DIAGNOSIS — A419 Sepsis, unspecified organism: Secondary | ICD-10-CM | POA: Diagnosis not present

## 2015-01-24 DIAGNOSIS — E785 Hyperlipidemia, unspecified: Secondary | ICD-10-CM | POA: Diagnosis present

## 2015-01-24 DIAGNOSIS — Z66 Do not resuscitate: Secondary | ICD-10-CM | POA: Diagnosis present

## 2015-01-24 DIAGNOSIS — R05 Cough: Secondary | ICD-10-CM | POA: Diagnosis not present

## 2015-01-24 DIAGNOSIS — J449 Chronic obstructive pulmonary disease, unspecified: Secondary | ICD-10-CM | POA: Diagnosis not present

## 2015-01-24 DIAGNOSIS — I10 Essential (primary) hypertension: Secondary | ICD-10-CM | POA: Diagnosis present

## 2015-01-24 DIAGNOSIS — Z9981 Dependence on supplemental oxygen: Secondary | ICD-10-CM | POA: Diagnosis not present

## 2015-01-24 DIAGNOSIS — J42 Unspecified chronic bronchitis: Secondary | ICD-10-CM | POA: Diagnosis not present

## 2015-01-24 DIAGNOSIS — Z7902 Long term (current) use of antithrombotics/antiplatelets: Secondary | ICD-10-CM

## 2015-01-24 DIAGNOSIS — J189 Pneumonia, unspecified organism: Secondary | ICD-10-CM | POA: Diagnosis present

## 2015-01-24 DIAGNOSIS — Z87891 Personal history of nicotine dependence: Secondary | ICD-10-CM

## 2015-01-24 DIAGNOSIS — Z823 Family history of stroke: Secondary | ICD-10-CM | POA: Diagnosis not present

## 2015-01-24 DIAGNOSIS — I4891 Unspecified atrial fibrillation: Secondary | ICD-10-CM | POA: Diagnosis not present

## 2015-01-24 DIAGNOSIS — Z8673 Personal history of transient ischemic attack (TIA), and cerebral infarction without residual deficits: Secondary | ICD-10-CM

## 2015-01-24 DIAGNOSIS — Z7982 Long term (current) use of aspirin: Secondary | ICD-10-CM

## 2015-01-24 DIAGNOSIS — Z8711 Personal history of peptic ulcer disease: Secondary | ICD-10-CM

## 2015-01-24 DIAGNOSIS — J441 Chronic obstructive pulmonary disease with (acute) exacerbation: Secondary | ICD-10-CM | POA: Diagnosis present

## 2015-01-24 DIAGNOSIS — R0602 Shortness of breath: Secondary | ICD-10-CM | POA: Diagnosis not present

## 2015-01-24 LAB — COMPREHENSIVE METABOLIC PANEL
ALK PHOS: 88 U/L (ref 38–126)
ALT: 9 U/L — AB (ref 17–63)
AST: 22 U/L (ref 15–41)
Albumin: 2.9 g/dL — ABNORMAL LOW (ref 3.5–5.0)
Anion gap: 9 (ref 5–15)
BUN: 13 mg/dL (ref 6–20)
CO2: 24 mmol/L (ref 22–32)
Calcium: 8 mg/dL — ABNORMAL LOW (ref 8.9–10.3)
Chloride: 102 mmol/L (ref 101–111)
Creatinine, Ser: 0.82 mg/dL (ref 0.61–1.24)
GFR calc Af Amer: >60 mL/min (ref >60)
GFR calc non Af Amer: >60 mL/min (ref >60)
GLUCOSE: 166 mg/dL — AB (ref 65–99)
POTASSIUM: 4.8 mmol/L (ref 3.5–5.1)
Sodium: 135 mmol/L (ref 135–145)
TOTAL PROTEIN: 7.2 g/dL (ref 6.5–8.1)
Total Bilirubin: 0.7 mg/dL (ref 0.3–1.2)

## 2015-01-24 LAB — BLOOD GAS, ARTERIAL
Acid-Base Excess: 2.8 mmol/L (ref 0.0–3.0)
Bicarbonate: 26.1 mEq/L (ref 21.0–28.0)
FIO2: 0.4 %
O2 SAT: 99.3 %
PCO2 ART: 35 mmHg (ref 32.0–48.0)
Patient temperature: 37
pH, Arterial: 7.48 — ABNORMAL HIGH (ref 7.350–7.450)
pO2, Arterial: 141 mmHg — ABNORMAL HIGH (ref 83.0–108.0)

## 2015-01-24 LAB — CBC WITH DIFFERENTIAL/PLATELET
Basophils Absolute: 0 10*3/uL (ref 0–0.1)
Basophils Relative: 0 %
Eosinophils Absolute: 0 10*3/uL (ref 0–0.7)
Eosinophils Relative: 0 %
HEMATOCRIT: 33.2 % — AB (ref 40.0–52.0)
Hemoglobin: 10.8 g/dL — ABNORMAL LOW (ref 13.0–18.0)
Lymphocytes Relative: 2 %
Lymphs Abs: 0.4 10*3/uL — ABNORMAL LOW (ref 1.0–3.6)
MCH: 29.2 pg (ref 26.0–34.0)
MCHC: 32.4 g/dL (ref 32.0–36.0)
MCV: 90.1 fL (ref 80.0–100.0)
MONOS PCT: 4 %
Monocytes Absolute: 0.6 10*3/uL (ref 0.2–1.0)
Neutro Abs: 15.3 10*3/uL — ABNORMAL HIGH (ref 1.4–6.5)
Neutrophils Relative %: 94 %
Platelets: 264 10*3/uL (ref 150–440)
RBC: 3.68 MIL/uL — ABNORMAL LOW (ref 4.40–5.90)
RDW: 15.8 % — ABNORMAL HIGH (ref 11.5–14.5)
WBC: 16.3 10*3/uL — ABNORMAL HIGH (ref 3.8–10.6)

## 2015-01-24 LAB — TROPONIN I

## 2015-01-24 LAB — LACTIC ACID, PLASMA: LACTIC ACID, VENOUS: 2.4 mmol/L — AB (ref 0.5–2.0)

## 2015-01-24 MED ORDER — ASPIRIN EC 81 MG PO TBEC
81.0000 mg | DELAYED_RELEASE_TABLET | ORAL | Status: DC
  Administered 2015-01-25: 81 mg via ORAL
  Filled 2015-01-24 (×2): qty 1

## 2015-01-24 MED ORDER — SODIUM CHLORIDE 0.9 % IJ SOLN
3.0000 mL | Freq: Two times a day (BID) | INTRAMUSCULAR | Status: DC
  Administered 2015-01-25 (×2): 3 mL via INTRAVENOUS

## 2015-01-24 MED ORDER — CEFTRIAXONE SODIUM IN DEXTROSE 20 MG/ML IV SOLN
1.0000 g | Freq: Once | INTRAVENOUS | Status: AC
  Administered 2015-01-24: 1 g via INTRAVENOUS

## 2015-01-24 MED ORDER — SODIUM CHLORIDE 0.9 % IV BOLUS (SEPSIS)
1000.0000 mL | Freq: Once | INTRAVENOUS | Status: AC
  Administered 2015-01-24: 1000 mL via INTRAVENOUS

## 2015-01-24 MED ORDER — IPRATROPIUM-ALBUTEROL 0.5-2.5 (3) MG/3ML IN SOLN
RESPIRATORY_TRACT | Status: AC
  Administered 2015-01-24: 3 mL via RESPIRATORY_TRACT
  Filled 2015-01-24: qty 3

## 2015-01-24 MED ORDER — ONDANSETRON HCL 4 MG/2ML IJ SOLN: 4.0000 mg | Freq: Four times a day (QID) | INTRAMUSCULAR | Status: DC | PRN

## 2015-01-24 MED ORDER — DOCUSATE SODIUM 100 MG PO CAPS
100.0000 mg | ORAL_CAPSULE | Freq: Two times a day (BID) | ORAL | Status: DC
  Administered 2015-01-24 – 2015-01-26 (×4): 100 mg via ORAL
  Filled 2015-01-24 (×4): qty 1

## 2015-01-24 MED ORDER — MORPHINE SULFATE 2 MG/ML IJ SOLN: 1.0000 mg | INTRAMUSCULAR | Status: DC | PRN

## 2015-01-24 MED ORDER — DEXTROSE 5 % IV SOLN
INTRAVENOUS | Status: AC
  Administered 2015-01-24: 500 mg via INTRAVENOUS
  Filled 2015-01-24: qty 500

## 2015-01-24 MED ORDER — ONDANSETRON HCL 4 MG PO TABS: 4.0000 mg | ORAL_TABLET | Freq: Four times a day (QID) | ORAL | Status: DC | PRN

## 2015-01-24 MED ORDER — VITAMIN D 1000 UNITS PO TABS
1000.0000 [IU] | ORAL_TABLET | Freq: Every day | ORAL | Status: DC
  Administered 2015-01-25 – 2015-01-26 (×2): 1000 [IU] via ORAL
  Filled 2015-01-24 (×2): qty 1

## 2015-01-24 MED ORDER — DEXTROSE 5 % IV SOLN
500.0000 mg | Freq: Once | INTRAVENOUS | Status: AC
  Administered 2015-01-24: 500 mg via INTRAVENOUS

## 2015-01-24 MED ORDER — RIVAROXABAN 20 MG PO TABS
20.0000 mg | ORAL_TABLET | Freq: Every day | ORAL | Status: DC
  Administered 2015-01-25 – 2015-01-26 (×2): 20 mg via ORAL
  Filled 2015-01-24 (×2): qty 1

## 2015-01-24 MED ORDER — PREDNISOLONE ACETATE 1 % OP SUSP
1.0000 [drp] | Freq: Every day | OPHTHALMIC | Status: DC
  Filled 2015-01-24: qty 1

## 2015-01-24 MED ORDER — DEXTROSE 5 % IV SOLN
INTRAVENOUS | Status: AC
  Filled 2015-01-24: qty 10

## 2015-01-24 MED ORDER — MOXIFLOXACIN HCL 0.5 % OP SOLN
1.0000 [drp] | Freq: Four times a day (QID) | OPHTHALMIC | Status: DC
  Administered 2015-01-24 – 2015-01-26 (×6): 1 [drp] via OPHTHALMIC
  Filled 2015-01-24: qty 3

## 2015-01-24 MED ORDER — POLYETHYLENE GLYCOL 3350 17 G PO PACK
17.0000 g | PACK | Freq: Every day | ORAL | Status: DC
  Administered 2015-01-25 – 2015-01-26 (×2): 17 g via ORAL
  Filled 2015-01-24 (×2): qty 1

## 2015-01-24 MED ORDER — IPRATROPIUM-ALBUTEROL 0.5-2.5 (3) MG/3ML IN SOLN: 3.0000 mL | RESPIRATORY_TRACT | Status: DC | PRN

## 2015-01-24 MED ORDER — AZITHROMYCIN 250 MG PO TABS
250.0000 mg | ORAL_TABLET | Freq: Every day | ORAL | Status: DC
  Administered 2015-01-25 – 2015-01-26 (×2): 250 mg via ORAL
  Filled 2015-01-24 (×2): qty 1

## 2015-01-24 MED ORDER — ACETAMINOPHEN 325 MG PO TABS: 650.0000 mg | ORAL_TABLET | Freq: Four times a day (QID) | ORAL | Status: DC | PRN

## 2015-01-24 MED ORDER — FUROSEMIDE 20 MG PO TABS
20.0000 mg | ORAL_TABLET | Freq: Every day | ORAL | Status: DC
  Administered 2015-01-26: 20 mg via ORAL
  Filled 2015-01-24 (×2): qty 1

## 2015-01-24 MED ORDER — CETYLPYRIDINIUM CHLORIDE 0.05 % MT LIQD
7.0000 mL | Freq: Two times a day (BID) | OROMUCOSAL | Status: DC
  Administered 2015-01-24 – 2015-01-25 (×3): 7 mL via OROMUCOSAL

## 2015-01-24 MED ORDER — IPRATROPIUM-ALBUTEROL 0.5-2.5 (3) MG/3ML IN SOLN
3.0000 mL | Freq: Once | RESPIRATORY_TRACT | Status: AC
  Administered 2015-01-24: 3 mL via RESPIRATORY_TRACT

## 2015-01-24 MED ORDER — ACETAMINOPHEN 650 MG RE SUPP: 650.0000 mg | Freq: Four times a day (QID) | RECTAL | Status: DC | PRN

## 2015-01-24 MED ORDER — ALBUTEROL SULFATE (2.5 MG/3ML) 0.083% IN NEBU
INHALATION_SOLUTION | RESPIRATORY_TRACT | Status: AC
  Administered 2015-01-24: 5 mg via RESPIRATORY_TRACT
  Filled 2015-01-24: qty 6

## 2015-01-24 MED ORDER — MIRTAZAPINE 15 MG PO TABS
7.5000 mg | ORAL_TABLET | Freq: Every day | ORAL | Status: DC
Start: 2015-01-24 — End: 2015-01-26
  Administered 2015-01-24 – 2015-01-25 (×2): 7.5 mg via ORAL
  Filled 2015-01-24 (×4): qty 1

## 2015-01-24 MED ORDER — CEFTRIAXONE SODIUM IN DEXTROSE 20 MG/ML IV SOLN
1.0000 g | INTRAVENOUS | Status: DC
  Administered 2015-01-25: 1 g via INTRAVENOUS
  Filled 2015-01-24 (×2): qty 50

## 2015-01-24 MED ORDER — MOMETASONE FURO-FORMOTEROL FUM 100-5 MCG/ACT IN AERO
2.0000 | INHALATION_SPRAY | Freq: Two times a day (BID) | RESPIRATORY_TRACT | Status: DC
  Administered 2015-01-24 – 2015-01-26 (×4): 2 via RESPIRATORY_TRACT
  Filled 2015-01-24: qty 8.8

## 2015-01-24 MED ORDER — ALBUTEROL SULFATE (2.5 MG/3ML) 0.083% IN NEBU
5.0000 mg | INHALATION_SOLUTION | Freq: Once | RESPIRATORY_TRACT | Status: AC
  Administered 2015-01-24: 5 mg via RESPIRATORY_TRACT

## 2015-01-24 MED ORDER — SODIUM CHLORIDE 0.9 % IV BOLUS (SEPSIS)
500.0000 mL | Freq: Once | INTRAVENOUS | Status: AC
  Administered 2015-01-24: 500 mL via INTRAVENOUS

## 2015-01-24 MED ORDER — DILTIAZEM HCL ER 120 MG PO CP24
120.0000 mg | ORAL_CAPSULE | Freq: Every day | ORAL | Status: DC
  Administered 2015-01-25 – 2015-01-26 (×2): 120 mg via ORAL
  Filled 2015-01-24 (×4): qty 1

## 2015-01-24 NOTE — ED Notes (Signed)
Family thinks patient is dehydrated. States he is shaking all over.

## 2015-01-24 NOTE — H&P (Addendum)
[name] [name] is an 81 y.o. male.   Chief Complaint: Weakness HPI: The patient presents emergency department complaining of extreme weakness and shakiness. The patient states that he's had cough 2-3 days. Sometimes he brings up some phlegm but otherwise has been nonproductive. He denies fevers but admits to nausea, shortness of breath and constipation. He admits to decreased appetite as well. In the emergency department the patient was found to have an increased oxygen requirement of 3 L/m via nasal cannula (at home on 2 L via nasal cannula at baseline). Chest x-ray revealed worsening left basilar opacity which prompted the emergency department staff to call for admission.  Past Medical History  Diagnosis Date  . Cataract     2004- Cataract removal, 2007 Right eye cataract  . COPD (chronic obstructive pulmonary disease)   . Gastric ulcer     1977- Gastric Ulcers with hemorrhage and surgical repair  . Atrial fibrillation   . Hypertension   . Hyperlipidemia   . Stroke   . Pneumonia   . Fracture of femoral neck, right     Past Surgical History  Procedure Laterality Date  . Laceration repair      1983- laceration right wrist   . Carotid endarterectomy  214    right===Dr Dew  . Right hip hemiarthroplasty    . Fracture surgery Right 2014    hip    Family History  Problem Relation Age of Onset  . Stroke Mother   . Cancer Neg Hx    Social History:  reports that he quit smoking about 3 years ago. He has never used smokeless tobacco. He reports that he does not drink alcohol or use illicit drugs.  Allergies:  Allergies  Allergen Reactions  . No Known Allergies     Prior to Admission medications   Medication Sig Start Date End Date Taking? Authorizing Provider  ADVAIR DISKUS 250-50 MCG/DOSE AEPB INHALE 1 PUFF BY MOUTH 2 TIMES A DAY. **RINSE MOUTH AFTER EACH USE** 11/21/14  Yes Venia Carbon, MD  aspirin 81 MG tablet Take 81 mg by mouth every other day.    Yes Historical  Provider, MD  Cholecalciferol (D3-1000) 1000 UNITS capsule Take 1,000 Units by mouth daily.   Yes Historical Provider, MD  diltiazem (CARDIZEM) 120 MG tablet TAKE 1 TABLET BY MOUTH ONCE A DAY 08/27/14  Yes Venia Carbon, MD  furosemide (LASIX) 20 MG tablet TAKE 1 TABLET BY MOUTH ONCE A DAY 08/29/14  Yes Venia Carbon, MD  mirtazapine (REMERON) 15 MG tablet Take 0.5 tablets (7.5 mg total) by mouth at bedtime. 09/05/14  Yes Venia Carbon, MD  polyethylene glycol [facility] / GLYCOLAX) packet Take 17 g by mouth daily.   Yes Historical Provider, MD  prednisoLONE acetate (PRED FORTE) 1 % ophthalmic suspension Place 1 drop into the right eye daily. 12/26/14  Yes Historical Provider, MD  traMADol (ULTRAM) 50 MG tablet Take 1 tablet (50 mg total) by mouth 3 (three) times daily as needed. 11/07/14  Yes Venia Carbon, MD  VIGAMOX 0.5 % ophthalmic solution Place 1 drop into the right eye 4 (four) times daily. 12/31/14  Yes Historical Provider, MD  XARELTO 20 MG TABS tablet TAKE 1 TABLET BY MOUTH EVERY DAY WITH SUPPER 10/30/14  Yes Venia Carbon, MD     Results for orders placed or performed during the hospital encounter of 01/24/15 (from the past 48 hour(s))  Comprehensive metabolic panel     Status: Abnormal   Collection  Time: 01/24/15  8:28 PM  Result Value Ref Range   Sodium 135 135 - 145 mmol/L   Potassium 4.8 3.5 - 5.1 mmol/L   Chloride 102 101 - 111 mmol/L   CO2 24 22 - 32 mmol/L   Glucose, Bld 166 (H) 65 - 99 mg/dL   BUN 13 6 - 20 mg/dL   Creatinine, Ser 0.82 0.61 - 1.24 mg/dL   Calcium 8.0 (L) 8.9 - 10.3 mg/dL   Total Protein 7.2 6.5 - 8.1 g/dL   Albumin 2.9 (L) 3.5 - 5.0 g/dL   AST 22 15 - 41 U/L   ALT 9 (L) 17 - 63 U/L   Alkaline Phosphatase 88 38 - 126 U/L   Total Bilirubin 0.7 0.3 - 1.2 mg/dL   GFR calc non Af Amer >60 >60 mL/min   GFR calc Af Amer >60 >60 mL/min    Comment: (NOTE) The eGFR has been calculated using the CKD EPI equation. This calculation has not been validated  in all clinical situations. eGFR's persistently <60 mL/min signify possible Chronic Kidney Disease.    Anion gap 9 5 - 15  Troponin I     Status: None   Collection Time: 01/24/15  8:28 PM  Result Value Ref Range   Troponin I <0.03 <0.031 ng/mL    Comment:        NO INDICATION OF MYOCARDIAL INJURY.   CBC with Differential     Status: Abnormal   Collection Time: 01/24/15  8:28 PM  Result Value Ref Range   WBC 16.3 (H) 3.8 - 10.6 K/uL   RBC 3.68 (L) 4.40 - 5.90 MIL/uL   Hemoglobin 10.8 (L) 13.0 - 18.0 g/dL   HCT 33.2 (L) 40.0 - 52.0 %   MCV 90.1 80.0 - 100.0 fL   MCH 29.2 26.0 - 34.0 pg   MCHC 32.4 32.0 - 36.0 g/dL   RDW 15.8 (H) 11.5 - 14.5 %   Platelets 264 150 - 440 K/uL   Neutrophils Relative % 94 %   Neutro Abs 15.3 (H) 1.4 - 6.5 K/uL   Lymphocytes Relative 2 %   Lymphs Abs 0.4 (L) 1.0 - 3.6 K/uL   Monocytes Relative 4 %   Monocytes Absolute 0.6 0.2 - 1.0 K/uL   Eosinophils Relative 0 %   Eosinophils Absolute 0.0 0 - 0.7 K/uL   Basophils Relative 0 %   Basophils Absolute 0.0 0 - 0.1 K/uL  Blood gas, arterial     Status: Abnormal   Collection Time: 01/24/15  8:46 PM  Result Value Ref Range   FIO2 0.40 %   pH, Arterial 7.48 (H) 7.350 - 7.450   pCO2 arterial 35 32.0 - 48.0 mmHg   pO2, Arterial 141 (H) 83.0 - 108.0 mmHg   Bicarbonate 26.1 21.0 - 28.0 mEq/L   Acid-Base Excess 2.8 0.0 - 3.0 mmol/L   O2 Saturation 99.3 %   Patient temperature 37.0    Collection site LEFT BRACHIAL    Drawn by DT    Sample type ARTERIAL DRAW    Allens test (pass/fail) PASS PASS  Lactic acid, plasma     Status: Abnormal   Collection Time: 01/24/15  8:59 PM  Result Value Ref Range   Lactic Acid, Venous 2.4 (HH) 0.5 - 2.0 mmol/L    Comment: CRITICAL RESULT CALLED TO, READ BACK BY AND VERIFIED WITH  TERRI [facility] AT 2209 01/24/15 SDR RESULTS VERIFIED BY REPEAT TESTING    Dg Chest 2 View  01/24/2015  CLINICAL DATA:  Nausea, shortness of breath, cough, COPD. History of smoking.  EXAM:  CHEST  2 VIEW  COMPARISON:  09/05/2014; 07/18/2014; 01/27/2013  FINDINGS: There is persistent obscuration of the left heart border secondary to persistent consolidative airspace opacities within the left lower lung. Worsening ill-defined heterogeneous slightly reticular opacities are seen within the left upper and mid lung. Grossly unchanged right perihilar linear heterogeneous opacities favored to represent atelectasis or scar. Suspected small left-sided pleural effusion. No pneumothorax. No definite evidence of edema. No acute osseus abnormalities.  IMPRESSION: Interval increase in size of persistent left basilar consolidative opacity worrisome for infection and/or aspiration though as this opacity has been present since the 07/2014 examination further evaluation with contrast-enhanced chest CT could be performed as clinically indicated.   Electronically Signed   By: Sandi Mariscal M.D.   On: 01/24/2015 21:27    Review of Systems  Constitutional: Negative for fever and chills.  HENT: Negative for sore throat and tinnitus.   Eyes: Negative for blurred vision and redness.  Respiratory: Positive for cough and shortness of breath.   Cardiovascular: Negative for chest pain, palpitations, orthopnea and PND.  Gastrointestinal: Positive for constipation. Negative for nausea, vomiting, abdominal pain and diarrhea.  Genitourinary: Negative for dysuria, urgency and frequency.  Musculoskeletal: Negative for myalgias and joint pain.  Skin: Negative for rash.       No lesions  Neurological: Positive for weakness. Negative for speech change and focal weakness.  Endo/Heme/Allergies: Does not bruise/bleed easily.       No temperature intolerance  Psychiatric/Behavioral: Negative for depression and suicidal ideas.    Blood pressure 122/69, pulse 102, temperature 97.4 F (36.3 C), temperature source Oral, resp. rate 20, height 6' (1.829 m), weight 62.596 kg (138 lb), SpO2 100 %. Physical Exam  Constitutional: He  is oriented to person, place, and time. He appears well-developed and well-nourished. No distress.  HENT:  Head: Normocephalic and atraumatic.  Mouth/Throat: No oropharyngeal exudate.  Presbycusis  Eyes: Conjunctivae and EOM are normal. Pupils are equal, round, and reactive to light. No scleral icterus.    Arcus senilis  Neck: Normal range of motion. Neck supple. No JVD present. No tracheal deviation present. No thyromegaly present.  Cardiovascular: Normal rate, regular rhythm and normal heart sounds.  Exam reveals no gallop and no friction rub.   No murmur heard. Respiratory: No respiratory distress. He has decreased breath sounds in the left lower field.  Increased oxygen requirement (via nasal cannula)  GI: Soft. Bowel sounds are normal. He exhibits no distension. There is no tenderness.  Genitourinary:  Deferred  Musculoskeletal: Normal range of motion. He exhibits no edema.  Lymphadenopathy:    He has no cervical adenopathy.  Neurological: He is alert and oriented to person, place, and time. No cranial nerve deficit.  Skin: Skin is warm and dry. No rash noted. No erythema.  Psychiatric: He has a normal mood and affect. His behavior is normal. Judgment and thought content normal.     Assessment/Plan This is a 81 year old Caucasian male admitted for acute on chronic respiratory failure with hypoxemia and pneumonia. 1. Acute on chronic respiratory failure: Hypoxemia present. Supply supplemental oxygen as needed. Due to the patient's history of COPD also add steroids to his treatment regimen. DuoNeb treatments as needed. 2. Pneumonia: Community-acquired. The patient is been started on azithromycin and Rocephin. We will continue antibiotic therapy and follow blood cultures for growth and sensitivities. Patient does not consistently criteria for sepsis. 3. Atrial fibrillation: Stable. Continue  Xarelto 4. COPD: Continue inhalers per home regimen 5. Cataract: Continue eyedrops per home  regimen 6. DVT prophylaxis: As above 7. GI prophylaxis: None (patient has not had gastric ulcers in more than 40 years)  The patient is a full code. Time spent on admission orders and patient care approximately 35 minutes  Harrie Foreman 01/24/2015, 10:47 PM

## 2015-01-24 NOTE — ED Notes (Signed)
Pt arrived to the ED acompanied by his daughter for complaints of nausea and shaking. Pt states that he does not feel well and has noted a mild cough for 2 days. Pt is a COPD Pt and usually uses oxygen at 2LPM at home. Pt is AOx4.

## 2015-01-24 NOTE — ED Provider Notes (Signed)
[facility] Emergency Department Provider Note  ____________________________________________  Time seen: 8:30 PM  I have reviewed the triage vital signs and the nursing notes.   HISTORY  Chief Complaint Nausea and Shaking    HPI [name] [name] is a 81 y.o. male who complains of shortness of breath with productive cough for 2 days. The patient is also having profound generalized weakness and unable to get out of bed by himself and is not ambulatory. He normally uses a walker and ambulate throughout the house. They noted that he is also nauseated and not eating or drinking, and intermittently shaking. He normally uses 2 L of oxygen at home for COPD.  No chest pain vomiting diarrhea or syncope.   Past Medical History  Diagnosis Date  . Cataract     2004- Cataract removal, 2007 Right eye cataract  . COPD (chronic obstructive pulmonary disease)   . Gastric ulcer     1977- Gastric Ulcers with hemorrhage and surgical repair  . Atrial fibrillation   . Hypertension   . Hyperlipidemia   . Stroke   . Pneumonia   . Fracture of femoral neck, right     Patient Active Problem List   Diagnosis Date Noted  . Lumbar compression fracture 09/27/2014  . Abnormal chest x-ray with multiple lung nodules 09/17/2014  . LLL pneumonia 08/01/2014  . Preventative health care 07/11/2014  . Vascular dementia without behavioral disturbance 07/11/2014  . Mild malnutrition 07/11/2014  . Advanced directives, counseling/discussion 07/11/2014  . Actinic keratosis 06/20/2013  . Anemia 02/06/2013  . A-fib 02/02/2013  . Unspecified constipation 02/02/2013  . Urinary hesitancy 02/02/2013  . Osteoporosis, unspecified 02/02/2013  . TIA (transient ischemic attack) 07/05/2012  . Hyperlipidemia   . COPD exacerbation 06/15/2012  . Hypertension   . COPD (chronic obstructive pulmonary disease) 07/19/2009    Past Surgical History  Procedure Laterality Date  . Laceration repair       1983- laceration right wrist   . Carotid endarterectomy  214    right===Dr Dew  . Right hip hemiarthroplasty    . Fracture surgery Right 2014    hip    Current Outpatient Rx  Name  Route  Sig  Dispense  Refill  . ADVAIR DISKUS 250-50 MCG/DOSE AEPB      INHALE 1 PUFF BY MOUTH 2 TIMES A DAY. **RINSE MOUTH AFTER EACH USE**   60 each   11   . aspirin 81 MG tablet   Oral   Take 81 mg by mouth every other day.          . Cholecalciferol (D3-1000) 1000 UNITS capsule   Oral   Take 1,000 Units by mouth daily.         [name] diltiazem (CARDIZEM) 120 MG tablet      TAKE 1 TABLET BY MOUTH ONCE A DAY   30 tablet   10   . furosemide (LASIX) 20 MG tablet      TAKE 1 TABLET BY MOUTH ONCE A DAY   90 tablet   3   . mirtazapine (REMERON) 15 MG tablet   Oral   Take 0.5 tablets (7.5 mg total) by mouth at bedtime.   30 tablet   11     Increase to full tab after 2 weeks   . polyethylene glycol (MIRALAX / GLYCOLAX) packet   Oral   Take 17 g by mouth daily.         . traMADol (ULTRAM) 50 MG tablet  Oral   Take 1 tablet (50 mg total) by mouth 3 (three) times daily as needed.   90 tablet   0   . XARELTO 20 MG TABS tablet      TAKE 1 TABLET BY MOUTH EVERY DAY WITH SUPPER   30 tablet   11     Allergies No known allergies  Family History  Problem Relation Age of Onset  . Stroke Mother   . Cancer Neg Hx     Social History History  Substance Use Topics  . Smoking status: Former Smoker    Quit date: 03/07/2011  . Smokeless tobacco: Never Used  . Alcohol Use: No    Review of Systems  Constitutional: Positive chills. No weight changes Eyes:No blurry vision or double vision.  ENT: No sore throat. Cardiovascular: No chest pain. Respiratory: Dyspnea and cough as above. Gastrointestinal: Negative for abdominal pain, vomiting and diarrhea.  No BRBPR or melena. Genitourinary: Negative for dysuria, urinary retention, bloody urine, or difficulty  urinating. Musculoskeletal: Negative for back pain. No joint swelling or pain. Skin: Negative for rash. Neurological: Negative for headaches, focal weakness or numbness. Psychiatric:No anxiety or depression.   Endocrine:No hot/cold intolerance, changes in energy, or sleep difficulty.  10-point ROS otherwise negative.  ____________________________________________   PHYSICAL EXAM:  VITAL SIGNS: ED Triage Vitals  Enc Vitals Group     BP 01/24/15 2003 148/73 mmHg     Pulse Rate 01/24/15 2003 66     Resp 01/24/15 2003 22     Temp 01/24/15 2003 97.4 F (36.3 C)     Temp Source 01/24/15 2003 Oral     SpO2 01/24/15 2003 85 %     Weight 01/24/15 2003 138 lb (62.596 kg)     Height 01/24/15 2003 6' (1.829 m)     Head Cir --      Peak Flow --      Pain Score 01/24/15 2005 0     Pain Loc --      Pain Edu? --      Excl. in GC? --      Constitutional: Alert and oriented. Ill appearing. Eyes: No scleral icterus. No conjunctival pallor. PERRL. EOMI ENT   Head: Normocephalic and atraumatic.   Nose: No congestion/rhinnorhea. No septal hematoma   Mouth/Throat: Dry mucous membranes, no pharyngeal erythema. No peritonsillar mass. No uvula shift.   Neck: No stridor. No SubQ emphysema. No meningismus. Hematological/Lymphatic/Immunilogical: No cervical lymphadenopathy. Cardiovascular: Irregularly irregular rhythm, heart rate 90-100. Normal and symmetric distal pulses are present in all extremities. No murmurs, rubs, or gallops. Respiratory: Coarse breath sounds throughout, right greater than left. Prolonged expirations with expiratory wheezing. Gastrointestinal: Soft and nontender. No distention. There is no CVA tenderness.  No rebound, rigidity, or guarding. Genitourinary: deferred Musculoskeletal: Nontender with normal range of motion in all extremities. No joint effusions.  No lower extremity tenderness.  No edema. Neurologic:   Normal speech and language.  CN 2-10  normal. Motor grossly intact. No pronator drift.  Normal gait. No gross focal neurologic deficits are appreciated.  Skin:  Skin is warm, dry and intact. No rash noted.  No petechiae, purpura, or bullae. Psychiatric: Mood and affect are normal. Speech and behavior are normal. Patient exhibits appropriate insight and judgment.  ____________________________________________    LABS (pertinent positives/negatives) (all labs ordered are listed, but only abnormal results are displayed) Labs Reviewed  CBC WITH DIFFERENTIAL/PLATELET - Abnormal; Notable for the following:    WBC 16.3 (*)    RBC  3.68 (*)    Hemoglobin 10.8 (*)    HCT 33.2 (*)    RDW 15.8 (*)    Neutro Abs 15.3 (*)    Lymphs Abs 0.4 (*)    All other components within normal limits  CULTURE, BLOOD (ROUTINE X 2)  CULTURE, BLOOD (ROUTINE X 2)  COMPREHENSIVE METABOLIC PANEL  TROPONIN I  BLOOD GAS, ARTERIAL  LACTIC ACID, PLASMA   ____________________________________________   EKG  Interpreted by me Atrial fibrillation with rapid ventricular response rate 122. Normal axis intervals QRS and ST segments and T waves.  ____________________________________________    RADIOLOGY  Left base consolidation on chest x-ray  ____________________________________________   PROCEDURES CRITICAL CARE Performed by: Scotty Court, Alaa Mullally   Total critical care time: 35 minutes  Critical care time was exclusive of separately billable procedures and treating other patients.  Critical care was necessary to treat or prevent imminent or life-threatening deterioration.  Critical care was time spent personally by me on the following activities: development of treatment plan with patient and/or surrogate as well as nursing, discussions with consultants, evaluation of patient's response to treatment, examination of patient, obtaining history from patient or surrogate, ordering and performing treatments and interventions, ordering and review of  laboratory studies, ordering and review of radiographic studies, pulse oximetry and re-evaluation of patient's condition.  ____________________________________________   INITIAL IMPRESSION / ASSESSMENT AND PLAN / ED COURSE  Pertinent labs & imaging results that were available during my care of the patient were reviewed by me and considered in my medical decision making (see chart for details).  Patient presents clinically with suspected community-acquired pneumonia as well as acute on chronic hypoxic respiratory failure with increased oxygen requirements. He is hypoxic to 85% on his usual 2 L nasal cannula, and his oxygenation is adequate on 4 L nasal cannula. He also had tachycardia on EKG although his other vital signs are otherwise unremarkable.  We'll send labs including cultures and lactic acid, get chest x-ray, 500 mL fluid challenge, ceftriaxone and azithromycin, and plan to admit for COPD exacerbation, acute on chronic respiratory failure, and pneumonia with early sepsis.  ____________________________________________   FINAL CLINICAL IMPRESSION(S) / ED DIAGNOSES  Final diagnoses:  Acute on chronic respiratory failure with hypoxia  Sepsis, due to unspecified organism   acute exacerbation of chronic bronchitis    Sharman Cheek, MD 01/24/15 2146

## 2015-01-25 LAB — HEMOGLOBIN A1C: Hgb A1c MFr Bld: 5.7 % (ref 4.0–6.0)

## 2015-01-25 LAB — TSH: TSH: 1.836 u[IU]/mL (ref 0.350–4.500)

## 2015-01-25 MED ORDER — PREDNISONE 50 MG PO TABS
50.0000 mg | ORAL_TABLET | Freq: Every day | ORAL | Status: DC
  Administered 2015-01-26: 09:00:00 50 mg via ORAL
  Filled 2015-01-25 (×2): qty 1

## 2015-01-25 MED ORDER — PREDNISOLONE ACETATE 1 % OP SUSP
1.0000 [drp] | Freq: Every day | OPHTHALMIC | Status: DC
  Administered 2015-01-25 – 2015-01-26 (×2): 1 [drp] via OPHTHALMIC
  Filled 2015-01-25: qty 5

## 2015-01-25 NOTE — Progress Notes (Signed)
[facility] [place] [facility]   PATIENT NAME: [name] [name]    MR#:  [id number]  DATE OF BIRTH:  03-20-1921  SUBJECTIVE:  CHIEF COMPLAINT:   Chief Complaint  Patient presents with  . Nausea  . Shaking   Very weak, short of breath with weak cough   REVIEW OF SYSTEMS:   Review of Systems  Constitutional: Negative for fever.  Respiratory: Positive for cough, sputum production and shortness of breath.   Cardiovascular: Negative for chest pain and palpitations.  Gastrointestinal: Negative for nausea, vomiting and abdominal pain.  Genitourinary: Negative for dysuria.    DRUG ALLERGIES:   Allergies  Allergen Reactions  . No Known Allergies     VITALS:  Blood pressure 109/71, pulse 90, temperature 97.8 F (36.6 C), temperature source Oral, resp. rate 18, height 6' (1.829 m), weight 58.06 kg (128 lb), SpO2 99 %.  PHYSICAL EXAMINATION:  GENERAL:  81 y.o.-year-old patient lying in the bed, thin, frial LUNGS: short shallow respirations, diffuse wheezes and crackles.  No use of accessory muscles of respiration.  CARDIOVASCULAR: distant S1, S2 normal. No murmurs, rubs, or gallops.  ABDOMEN: Soft, nontender, nondistended. Bowel sounds present. No organomegaly or mass.  EXTREMITIES: No pedal edema, cyanosis, or clubbing. Muscle atrophy NEUROLOGIC: Cranial nerves II through XII are intact. Muscle strength 4/5 in all extremities.  PSYCHIATRIC: The patient is alert, oriented to person, calm  SKIN: No obvious rash, lesion, or ulcer.    LABORATORY PANEL:   CBC  Recent Labs Lab 01/24/15 2028  WBC 16.3*  HGB 10.8*  HCT 33.2*  PLT 264   ------------------------------------------------------------------------------------------------------------------  Chemistries   Recent Labs Lab 01/24/15 2028  NA 135  K 4.8  CL 102  CO2 24  GLUCOSE 166*  BUN 13  CREATININE 0.82  CALCIUM 8.0*  AST 22  ALT 9*  ALKPHOS 88  BILITOT 0.7    ------------------------------------------------------------------------------------------------------------------  Cardiac Enzymes  Recent Labs Lab 01/24/15 2028  TROPONINI <0.03   ------------------------------------------------------------------------------------------------------------------  RADIOLOGY:  Dg Chest 2 View  01/24/2015   CLINICAL DATA:  Nausea, shortness of breath, cough, COPD. History of smoking.  EXAM: CHEST  2 VIEW  COMPARISON:  09/05/2014; 07/18/2014; 01/27/2013  FINDINGS: There is persistent obscuration of the left heart border secondary to persistent consolidative airspace opacities within the left lower lung. Worsening ill-defined heterogeneous slightly reticular opacities are seen within the left upper and mid lung. Grossly unchanged right perihilar linear heterogeneous opacities favored to represent atelectasis or scar. Suspected small left-sided pleural effusion. No pneumothorax. No definite evidence of edema. No acute osseus abnormalities.  IMPRESSION: Interval increase in size of persistent left basilar consolidative opacity worrisome for infection and/or aspiration though as this opacity has been present since the 07/2014 examination further evaluation with contrast-enhanced chest CT could be performed as clinically indicated.   Electronically Signed   By: Simonne Come M.D.   On: 01/24/2015 21:27    EKG:   Orders placed or performed during the hospital encounter of 01/24/15  . EKG 12-Lead  . EKG 12-Lead    ASSESSMENT AND PLAN:   1. Acute on chronic respiratory failure:  - on 2L 02 at home due to COPD - now with CAP, on 4L - treatment outlined below  2. Pneumonia: Community-acquired.  - blood cultures pending - continue azithromycin and Rocephin  3. Atrial fibrillation: Stable. Continue Xarelto  4. COPD:  - start nebs, Prednisone 50 mg daily, continue home inhalers  5. Cataract: Continue eyedrops per  home regimen  6. DVT prophylaxis:  xarelto  7. GI prophylaxis: None    CODE STATUS: DNR  TOTAL TIME TAKING CARE OF THIS PATIENT: 40 minutes.  Greater than 50% of time spent in care coordination and counseling. All the records are reviewed and case discussed with Care Management/Social Worker as well as the patients daughter.  POSSIBLE D/C IN 2-3 DAYS, DEPENDING ON CLINICAL CONDITION.   Elby Showers M.D on 01/25/2015 at 1:27 PM  Between 7am to 6pm - Pager - [phone]  After 6pm go to [email or web address] - password [email or web address] [facility]  [facility]  Office  [phone]  CC: Primary care physician; Tillman Abide, MD

## 2015-01-25 NOTE — Evaluation (Signed)
Physical Therapy Evaluation Patient Details Name: [name] [name] MRN: [id number] DOB: Feb 17, 1921 Today's Date: 01/25/2015   History of Present Illness  Patient is a 81 y/o male that presents with 2-3 days of increased cough and phlegm. He has had multiple bouts of pneumonia over the past year and is being worked up for such on this admission.   Clinical Impression  Patient is a 81 y/o male that presents with cough and shortness of breath. Patient displays decreased LE strength initially, however this improves throughout the session. He is able to complete bed mobility and OOB transfers with similar assistance to what he will have at home as well as ambulating community distances with RW with average balance scores (not at high risk for falling when using RW). Patient would benefit from skilled acute PT services to address his decreased LE strength impacting his OOB transfers as well as his dyspnea with exertion.     Follow Up Recommendations Home health PT (For LE strengthening, patient is weak with sit to stand. )    Equipment Recommendations  Rolling walker with 5" wheels    Recommendations for Other Services       Precautions / Restrictions Precautions Precautions: Fall Restrictions Weight Bearing Restrictions: No      Mobility  Bed Mobility Overal bed mobility: Needs Assistance Bed Mobility: Supine to Sit     Supine to sit: Supervision     General bed mobility comments: Patient requires minimal use of hand rails.   Transfers Overall transfer level: Needs assistance Equipment used: Rolling walker (2 wheeled) Transfers: Sit to/from Stand Sit to Stand: Min guard         General transfer comment: Initially patient requires min A x1 to transfer sit to stand from bed, but after session patient is able to transfer sit to stand multiple times from chair with cga x 1.   Ambulation/Gait Ambulation/Gait assistance: Supervision Ambulation Distance (Feet): 360 Feet Assistive  device: Rolling walker (2 wheeled) Gait Pattern/deviations: WFL(Within Functional Limits)   Gait velocity interpretation: at or above normal speed for age/gender General Gait Details: Patient displays reciprocal gait pattern with RW with no loss of balance. Patient did decline in O2 to ~ 91%, and increased HR to 110s. Patient encouraged to ambulate at slower speeds for energy conservation.   Stairs            Wheelchair Mobility    Modified Rankin (Stroke Patients Only)       Balance Overall balance assessment: Needs assistance Sitting-balance support: Feet unsupported Sitting balance-Leahy Scale: Good     Standing balance support: Bilateral upper extremity supported Standing balance-Leahy Scale: Fair Standing balance comment: Patient completes 5x sit to stand in 15 seconds, and modified DGI at 10/12 indicating he is not in the "high falls risk" category.                              Pertinent Vitals/Pain Pain Assessment:  (Patient does not complain of any pain today. )    Home Living Family/patient expects to be discharged to:: Private residence Living Arrangements: Children;Other relatives Available Help at Discharge: Family Type of Home: House         Home Equipment: Dan Humphreys - 2 wheels Additional Comments: Deferred additional questioning as patient had to use the restroom.     Prior Function Level of Independence: Independent with assistive device(s)         Comments: Patient uses RW for  household mobility.      Hand Dominance        Extremity/Trunk Assessment   Upper Extremity Assessment: Overall WFL for tasks assessed           Lower Extremity Assessment: Overall WFL for tasks assessed      Cervical / Trunk Assessment: Normal  Communication   Communication: No difficulties  Cognition Arousal/Alertness: Awake/alert Behavior During Therapy: WFL for tasks assessed/performed Overall Cognitive Status: Within Functional Limits for  tasks assessed                      General Comments      Exercises Other Exercises Other Exercises: 5x sit to stand test - 15 seconds.       Assessment/Plan    PT Assessment Patient needs continued PT services  PT Diagnosis Generalized weakness;Difficulty walking   PT Problem List Decreased strength;Cardiopulmonary status limiting activity;Decreased safety awareness  PT Treatment Interventions DME instruction;Gait training;Therapeutic activities;Therapeutic exercise;Balance training   PT Goals (Current goals can be found in the Care Plan section) Acute Rehab PT Goals Patient Stated Goal: To return home safely.  PT Goal Formulation: With patient/family Time For Goal Achievement: 02/08/15 Potential to Achieve Goals: Good    Frequency Min 2X/week   Barriers to discharge        Co-evaluation               End of Session Equipment Utilized During Treatment: Gait belt Activity Tolerance: Patient tolerated treatment well Patient left: in chair;with call bell/phone within reach;with family/visitor present (Alarm continued to go off, turned it off and instructed family present not to let patient stand without RN presence. RN staff made aware. ) Nurse Communication: Mobility status         Time: 4098-1191 ((9 minutes spent on toileting)) PT Time Calculation (min) (ACUTE ONLY): 26 min   Charges:   PT Evaluation $Initial PT Evaluation Tier I: 1 Procedure PT Treatments $Therapeutic Activity: 8-22 mins   PT G Codes:       Kerin Ransom, PT, DPT    01/25/2015, 3:45 PM

## 2015-01-25 NOTE — Plan of Care (Signed)
Problem: Discharge Progression Outcomes Goal: Discharge plan in place and appropriate Individualization of Care Pt prefers to be called [name] [name] Pt with history of COPD, gastric ulcer, afib, HTN, hyperlipidemia, stroke, pneumonia on home medications HIGH fall precautions.  Goal: Other Discharge Outcomes/Goals Plan of Care Progress to Goal:  Pt noted with sob with minimal exertion, o2 at 4 lpm at present time with sats at 94%, pt placed on tele per orders, afib on monitor with hx of afib, family at bedside, no pain

## 2015-01-25 NOTE — Plan of Care (Addendum)
Problem: Discharge Progression Outcomes Goal: Other Discharge Outcomes/Goals Outcome: Progressing Patient had no c/o dyspnea this shift Patients o2 was reduced from 4L to 2L with o2 sats in the upper 90's  PT worked with patient today and is suggesting Home Health PT to build lower extremity strength No c/o pain this shift Patient is tolerating diet well  VSS, continues as chronic A-Fib on tele

## 2015-01-25 NOTE — Care Management (Signed)
Admitted to [facility] with the diagnosis of acute on chronic respiratory failure. Lives with daughter, Jamesetta So [phone]).  Daughter, Lupita Leash, takes care of Mr. Stiff in the home. Home oxygen thru [facility] x 3 years, [facility] about a year ago. Home health/physical therapy about a year ago, Unable to remember name of agency. . Uses a rolling walker to aid in ambulation. Fair -good appetite. No falls in the last 6 months. Hard of hearing. Sees Dr. Alphonsus Sias. Last seen in [place] 2016.

## 2015-01-26 LAB — BASIC METABOLIC PANEL
Anion gap: 5 (ref 5–15)
BUN: 10 mg/dL (ref 6–20)
CHLORIDE: 105 mmol/L (ref 101–111)
CO2: 27 mmol/L (ref 22–32)
CREATININE: 0.51 mg/dL — AB (ref 0.61–1.24)
Calcium: 8.1 mg/dL — ABNORMAL LOW (ref 8.9–10.3)
GFR calc non Af Amer: >60 mL/min (ref >60)
Glucose, Bld: 98 mg/dL (ref 65–99)
Potassium: 3.9 mmol/L (ref 3.5–5.1)
Sodium: 137 mmol/L (ref 135–145)

## 2015-01-26 LAB — CBC
HCT: 27.6 % — ABNORMAL LOW (ref 40.0–52.0)
Hemoglobin: 9 g/dL — ABNORMAL LOW (ref 13.0–18.0)
MCH: 29.1 pg (ref 26.0–34.0)
MCHC: 32.4 g/dL (ref 32.0–36.0)
MCV: 89.7 fL (ref 80.0–100.0)
PLATELETS: 182 10*3/uL (ref 150–440)
RBC: 3.08 MIL/uL — AB (ref 4.40–5.90)
RDW: 15.9 % — AB (ref 11.5–14.5)
WBC: 9.3 10*3/uL (ref 3.8–10.6)

## 2015-01-26 MED ORDER — DEXTROSE 5 % IV SOLN
1.0000 g | INTRAVENOUS | Status: DC
  Filled 2015-01-26: qty 10

## 2015-01-26 MED ORDER — DOXYCYCLINE HYCLATE 100 MG PO TABS: 100.0000 mg | ORAL_TABLET | Freq: Two times a day (BID) | ORAL | Status: DC

## 2015-01-26 MED ORDER — CEPHALEXIN 500 MG PO CAPS: 500.0000 mg | ORAL_CAPSULE | Freq: Two times a day (BID) | ORAL | Status: DC

## 2015-01-26 MED ORDER — PREDNISONE 10 MG PO TABS: ORAL_TABLET | ORAL | Status: DC

## 2015-01-26 MED ORDER — CEPHALEXIN 250 MG PO CAPS: 500.0000 mg | ORAL_CAPSULE | Freq: Two times a day (BID) | ORAL | Status: DC

## 2015-01-26 NOTE — Progress Notes (Signed)
No c/o pain today VSS, MD order discharge to home followed with [facility].  Patient still has some mild dyspnea on exertion, discharged to home with daughters in wheelchair

## 2015-01-26 NOTE — Plan of Care (Signed)
Problem: Discharge Progression Outcomes Goal: Discharge plan in place and appropriate Individualization of Care Pt history of COPD, gastric ulcer, afib, HTN, hyperlipidemia, stroke, pneumonia on home medications  Goal: Other Discharge Outcomes/Goals Plan of Care Progress to Goal:  Pt with sob with minimal exertion - up from bed to use urinal.  O2 at 2L with sats at 92%.  Patient continues on tele per orders for history of A-fib.  No complaints of pain or nausea this shift.  Family at bedside.  Patient unsteady on feet and on high fall precautions. BP 121/65 mmHg  Pulse 76  Temp(Src) 99.3 F (37.4 C) (Oral)  Resp 18  Ht 6' (1.829 m)  Wt 128 lb (58.06 kg)  BMI 17.36 kg/m2  SpO2 98%

## 2015-01-26 NOTE — Care Management Note (Signed)
Case Management Note  Patient Details  Name: Ayman Wiker MRN: [id number] Date of Birth: 09/07/1920  Subjective/Objective:          Spoke with daughter Jamesetta So with whom Mr Struble resides. Discussed discharge planning, reviewed list of local home health provider agencies. Mr Ellenwood has his current home oxygen supplied by [facility] and daughter Jamesetta So states that she would like to use [facility] to provide Mr Cartlidge's home PT also.  Mr Winokur already has a walker at home.           Action/Plan:   Expected Discharge Date:                  Expected Discharge Plan:     In-House Referral:     Discharge planning Services     Post Acute Care Choice:    Choice offered to:     DME Arranged:    DME Agency:     HH Arranged:    HH Agency:     Status of Service:     Medicare Important Message Given:    Date Medicare IM Given:    Medicare IM give by:    Date Additional Medicare IM Given:    Additional Medicare Important Message give by:     If discussed at Long Length of Stay Meetings, dates discussed:    Additional Comments:  Dreanna Kyllo A, RN 01/26/2015, 11:51 AM

## 2015-01-26 NOTE — Discharge Instructions (Signed)
Acute Respiratory Failure °Respiratory failure is when your lungs are not working well and your breathing (respiratory) system fails. When respiratory failure occurs, it is difficult for your lungs to get enough oxygen, get rid of carbon dioxide, or both. Respiratory failure can be life threatening.  °Respiratory failure can be acute or chronic. Acute respiratory failure is sudden, severe, and requires emergency medical treatment. Chronic respiratory failure is less severe, happens over time, and requires ongoing treatment.  °WHAT ARE THE CAUSES OF ACUTE RESPIRATORY FAILURE?  °Any problem affecting the heart or lungs can cause acute respiratory failure. Some of these causes include the following: °· Chronic bronchitis and emphysema (COPD).   °· Blood clot going to a lung (pulmonary embolism).   °· Having water in the lungs caused by heart failure, lung injury, or infection (pulmonary edema).   °· Collapsed lung (pneumothorax).   °· Pneumonia.   °· Pulmonary fibrosis.   °· Obesity.   °· Asthma.   °· Heart failure.   °· Any type of trauma to the chest that can make breathing difficult.   °· Nerve or muscle diseases making chest movements difficult. °WHAT SYMPTOMS SHOULD YOU WATCH FOR?  °If you have any of these signs or symptoms, you should seek immediate medical care:  °· You have shortness of breath (dyspnea) with or without activity.   °· You have rapid, fast breathing (tachypnea).   °· You are wheezing. °· You are unable to say more than a few words without having to catch your breath. °· You find it very difficult to function normally. °· You have a fast heart rate.   °· You have a bluish color to your finger or toe nail beds.   °· You have confusion or drowsiness or both.   °HOW WILL MY ACUTE RESPIRATORY FAILURE BE TREATED?  °Treatment of acute respiratory failure depends on the cause of the respiratory failure. Usually, you will stay in the intensive care unit so your breathing can be watched closely. Treatment  can include the following: °· Oxygen. Oxygen can be delivered through the following: °· Nasal cannula. This is small tubing that goes in your nose to give you oxygen. °· Face mask. A face mask covers your nose and mouth to give you oxygen. °· Medicine. Different medicines can be given to help with breathing. These can include: °· Nebulizers. Nebulizers deliver medicines to open the air passages (bronchodilators). These medicines help to open or relax the airways in the lungs so you can breathe better. They can also help loosen mucus from your lungs. °· Diuretics. Diuretic medicines can help you breathe better by getting rid of extra water in your body. °· Steroids. Steroid medicines can help decrease swelling (inflammation) in your lungs. °· Antibiotics. °· Chest tube. If you have a collapsed lung (pneumothorax), a chest tube is placed to help reinflate the lung. °· Non-invasive positive pressure ventilation (NPPV). This is a tight-fitting mask that goes over your nose and mouth. The mask has tubing that is attached to a machine. The machine blows air into the tubing, which helps to keep the tiny air sacs (alveoli) in your lungs open. This machine allows you to breathe on your own. °· Ventilator. A ventilator is a breathing machine. When on a ventilator, a breathing tube is put into the lungs. A ventilator is used when you can no longer breathe well enough on your own. You may have low oxygen levels or high carbon dioxide (CO2) levels in your blood. When you are on a ventilator, sedation and pain medicines are given to make you sleep   so your lungs can heal. Document Released: 06/27/2013 Document Revised: 11/06/2013 Document Reviewed: 06/27/2013 [facility] Patient Information 2015 [place], [place]. This information is not intended to replace advice given to you by your health care provider. Make sure you discuss any questions you have with your health care provider.  Antibiotic Medication Antibiotics are among the  most frequently prescribed medicines. Antibiotics cure illness by assisting our body to injure or kill the bacteria that cause infection. While antibiotics are useful to treat a wide variety of infections they are useless against viruses. Antibiotics cannot cure colds, flu, or other viral infections.  There are many types of antibiotics available. Your caregiver will decide which antibiotic will be useful for an illness. Never take or give someone else's antibiotics or left over medicine. Your caregiver may also take into account:  Allergies.  The cost of the medicine.  Dosing schedules.  Taste.  Common side effects when choosing an antibiotic for an infection. Ask your caregiver if you have questions about why a certain medicine was chosen. HOME CARE INSTRUCTIONS Read all instructions and labels on medicine bottles carefully. Some antibiotics should be taken on an empty stomach while others should be taken with food. Taking antibiotics incorrectly may reduce how well they work. Some antibiotics need to be kept in the refrigerator. Others should be kept at room temperature. Ask your caregiver or pharmacist if you do not understand how to give the medicine. Be sure to give the amount of medicine your caregiver has prescribed. Even if you feel better and your symptoms improve, bacteria may still remain alive in the body. Taking all of the medicine will prevent:  The infection from returning and becoming harder to treat.  Complications from partially treated infections. If there is any medicine left over after you have taken the medicine as your caregiver has instructed, throw the medicine away. Be sure to tell your caregiver if you:  Are allergic to any medicines.  Are pregnant or intend to become pregnant while using this medicine.  Are breastfeeding.  Are taking any other prescription, non-prescription medicine, or herbal remedies.  Have any other medical conditions or problems you  have not already discussed. If you are taking birth control pills, they may not work while you are on antibiotics. To avoid unwanted pregnancy:  Continue taking your birth control pills as usual.  Use a second form of birth control (such as condoms) while you are taking antibiotic medicine.  When you finish taking the antibiotic medicine, continue using the second form of birth control until you are finished with your current 1 month cycle of birth control pills. Try not to miss any doses of medicine. If you miss a dose, take it as soon as possible. However, if it is almost time for the next dose and the dosing schedule is:  2 doses a day, take the missed dose and the next dose 5 to 6 hours apart.  3 or more doses a day, take the missed dose and the next dose 2 to 4 hours apart, then go back to the normal schedule.  If you are unable to make up a missed dose, take the next scheduled dose on time and complete the missed dose at the end of the prescribed time for your medicine. SIDE EFFECTS TO TAKING ANTIBIOTICS Common side effects to antibiotic use include:  Soft stools or diarrhea.  Mild stomach upset.  Sun sensitivity. SEEK MEDICAL CARE IF:   If you get worse or do not improve  within a few days of starting the medicine.  Vomiting develops.  Diaper rash or rash on the genitals appears.  Vaginal itching occurs.  White patches appear on the tongue or in the mouth.  Severe watery diarrhea and abdominal cramps occur.  Signs of an allergy develop (hives, unknown itchy rash appears). STOP TAKING THE ANTIBIOTIC. SEEK IMMEDIATE MEDICAL CARE IF:   Urine turns dark or blood colored.  Skin turns yellow.  Easy bruising or bleeding occurs.  Joint pain or muscle aches occur.  Fever returns.  Severe headache occurs.  Signs of an allergy develop (trouble breathing, wheezing, swelling of the lips, face or tongue, fainting, or blisters on the skin or in the mouth). STOP TAKING THE  ANTIBIOTIC. Document Released: 03/04/2004 Document Revised: 09/14/2011 Document Reviewed: 03/14/2009 [facility] Patient Information 2015 [place], [place]. This information is not intended to replace advice given to you by your health care provider. Make sure you discuss any questions you have with your health care provider.

## 2015-01-26 NOTE — Discharge Summary (Signed)
[facility] - [place] at [facility]   PATIENT NAME: [name] [name]    MR#:  [id number]  DATE OF BIRTH:  1920-11-01  DATE OF ADMISSION:  01/24/2015 ADMITTING PHYSICIAN: Arnaldo Natal, MD  DATE OF DISCHARGE:01/26/2015  PRIMARY CARE PHYSICIAN: Tillman Abide, MD    ADMISSION DIAGNOSIS:  Acute on chronic respiratory failure with hypoxia [J96.21] Sepsis, due to unspecified organism [A41.9]  DISCHARGE DIAGNOSIS:  Pneumonia Acute on chronic respiratory failure, on chornic home oxygen  SECONDARY DIAGNOSIS:   Past Medical History  Diagnosis Date  . Cataract     2004- Cataract removal, 2007 Right eye cataract  . COPD (chronic obstructive pulmonary disease)   . Gastric ulcer     1977- Gastric Ulcers with hemorrhage and surgical repair  . Atrial fibrillation   . Hypertension   . Hyperlipidemia   . Stroke   . Pneumonia   . Fracture of femoral neck, right     HOSPITAL COURSE:  81 year old Caucasian male admitted for acute on chronic respiratory failure with hypoxemia and pneumonia. 1. Acute on chronic respiratory failure:  - on 2L 02 at home due to COPD - now with CAP, on 4L--->now down to 2liter [place] - treatment outlined below  2. Pneumonia: Community-acquired.  - blood cultures negative - continue azithromycin and Rocephin--->changed to generic per dter request po keflex and doxycycline -wbc normal  3. Atrial fibrillation: Stable. Continue Xarelto  4. COPD:  - start nebs, Prednisone 50 mg daily, continue home inhalers  5. Cataract: Continue eyedrops per home regimen  6. DVT prophylaxis: xarelto  7. GI prophylaxis: None  Overall appears nearing baseline D/c home with HHPT. Pt lives with dter  CODE STATUS: DNR   DISCHARGE CONDITIONS:   fair  CONSULTS OBTAINED:    none DRUG ALLERGIES:   Allergies  Allergen Reactions  . No Known Allergies     DISCHARGE MEDICATIONS:   Current Discharge Medication List    START taking these  medications   Details  cephALEXin (KEFLEX) 500 MG capsule Take 1 capsule (500 mg total) by mouth 2 (two) times daily. Qty: 10 capsule, Refills: 0    doxycycline (VIBRA-TABS) 100 MG tablet Take 1 tablet (100 mg total) by mouth every 12 (twelve) hours. Qty: 10 tablet, Refills: 0    predniSONE (DELTASONE) 10 MG tablet Label and dispense Day 1 50 mg Day 2 40 mg  Day 3 30 mg Day 4 20 mg Day 5 10 mg stop Qty: 15 tablet, Refills: 0      CONTINUE these medications which have NOT CHANGED   Details  ADVAIR DISKUS 250-50 MCG/DOSE AEPB INHALE 1 PUFF BY MOUTH 2 TIMES A DAY. **RINSE MOUTH AFTER EACH USE** Qty: 60 each, Refills: 11    aspirin 81 MG tablet Take 81 mg by mouth every other day.     Cholecalciferol (D3-1000) 1000 UNITS capsule Take 1,000 Units by mouth daily.    diltiazem (CARDIZEM) 120 MG tablet TAKE 1 TABLET BY MOUTH ONCE A DAY Qty: 30 tablet, Refills: 10    furosemide (LASIX) 20 MG tablet TAKE 1 TABLET BY MOUTH ONCE A DAY Qty: 90 tablet, Refills: 3    mirtazapine (REMERON) 15 MG tablet Take 0.5 tablets (7.5 mg total) by mouth at bedtime. Qty: 30 tablet, Refills: 11    polyethylene glycol (MIRALAX / GLYCOLAX) packet Take 17 g by mouth daily.    prednisoLONE acetate (PRED FORTE) 1 % ophthalmic suspension Place 1 drop into the right eye daily. Refills: 2  traMADol (ULTRAM) 50 MG tablet Take 1 tablet (50 mg total) by mouth 3 (three) times daily as needed. Qty: 90 tablet, Refills: 0    VIGAMOX 0.5 % ophthalmic solution Place 1 drop into the right eye 4 (four) times daily. Refills: 2    XARELTO 20 MG TABS tablet TAKE 1 TABLET BY MOUTH EVERY DAY WITH SUPPER Qty: 30 tablet, Refills: 11        If you experience worsening of your admission symptoms, develop shortness of breath, life threatening emergency, suicidal or homicidal thoughts you must seek medical attention immediately by calling 911 or calling your MD immediately  if symptoms less severe.  You Must read  complete instructions/literature along with all the possible adverse reactions/side effects for all the Medicines you take and that have been prescribed to you. Take any new Medicines after you have completely understood and accept all the possible adverse reactions/side effects.   Please note  You were cared for by a hospitalist during your hospital stay. If you have any questions about your discharge medications or the care you received while you were in the hospital after you are discharged, you can call the unit and asked to speak with the hospitalist on call if the hospitalist that took care of you is not available. Once you are discharged, your primary care physician will handle any further medical issues. Please note that NO REFILLS for any discharge medications will be authorized once you are discharged, as it is imperative that you return to your primary care physician (or establish a relationship with a primary care physician if you do not have one) for your aftercare needs so that they can reassess your need for medications and monitor your lab values. Today   SUBJECTIVE   i feel a whole lot better  VITAL SIGNS:  Blood pressure 122/72, pulse 85, temperature 97.5 F (36.4 C), temperature source Oral, resp. rate 18, height 6' (1.829 m), weight 60.011 kg (132 lb 4.8 oz), SpO2 98 %.  I/O:   Intake/Output Summary (Last 24 hours) at 01/26/15 1150 Last data filed at 01/26/15 0745  Gross per 24 hour  Intake    600 ml  Output    250 ml  Net    350 ml    PHYSICAL EXAMINATION:  GENERAL:  81 y.o.-year-old patient lying in the bed with no acute distress. Thin EYES: Pupils equal, round, reactive to light and accommodation. No scleral icterus. Extraocular muscles intact.  HEENT: Head atraumatic, normocephalic. Oropharynx and nasopharynx clear.  NECK:  Supple, no jugular venous distention. No thyroid enlargement, no tenderness.  LUNGS: Distant breath sounds bilaterally, no wheezing,  rales,rhonchi or crepitation. No use of accessory muscles of respiration.  CARDIOVASCULAR: S1, S2 normal. No murmurs, rubs, or gallops.  ABDOMEN: Soft, non-tender, non-distended. Bowel sounds present. No organomegaly or mass.  EXTREMITIES: No pedal edema, cyanosis, or clubbing.  NEUROLOGIC: Cranial nerves II through XII are intact. Muscle strength 5/5 in all extremities. Sensation intact. Gait not checked.  PSYCHIATRIC: The patient is alert and oriented x 3.  SKIN: No obvious rash, lesion, or ulcer.   DATA REVIEW:   CBC   Recent Labs Lab 01/26/15 0441  WBC 9.3  HGB 9.0*  HCT 27.6*  PLT 182    Chemistries   Recent Labs Lab 01/24/15 2028 01/26/15 0441  NA 135 137  K 4.8 3.9  CL 102 105  CO2 24 27  GLUCOSE 166* 98  BUN 13 10  CREATININE 0.82 0.51*  CALCIUM  8.0* 8.1*  AST 22  --   ALT 9*  --   ALKPHOS 88  --   BILITOT 0.7  --     Microbiology Results   Recent Results (from the past 240 hour(s))  Culture, blood (routine x 2)     Status: None (Preliminary result)   Collection Time: 01/24/15  8:28 PM  Result Value Ref Range Status   Specimen Description BLOOD LEFT WRIST  Final   Special Requests BOTTLES DRAWN AEROBIC AND ANAEROBIC  Final   Culture NO GROWTH < 24 HOURS  Final   Report Status PENDING  Incomplete  Culture, blood (routine x 2)     Status: None (Preliminary result)   Collection Time: 01/24/15  8:59 PM  Result Value Ref Range Status   Specimen Description BLOOD RIGHT ARM  Final   Special Requests BOTTLES DRAWN AEROBIC AND ANAEROBIC  Final   Culture NO GROWTH < 24 HOURS  Final   Report Status PENDING  Incomplete    RADIOLOGY:  Dg Chest 2 View  01/24/2015   CLINICAL DATA:  Nausea, shortness of breath, cough, COPD. History of smoking.  EXAM: CHEST  2 VIEW  COMPARISON:  09/05/2014; 07/18/2014; 01/27/2013  FINDINGS: There is persistent obscuration of the left heart border secondary to persistent consolidative airspace opacities within the left  lower lung. Worsening ill-defined heterogeneous slightly reticular opacities are seen within the left upper and mid lung. Grossly unchanged right perihilar linear heterogeneous opacities favored to represent atelectasis or scar. Suspected small left-sided pleural effusion. No pneumothorax. No definite evidence of edema. No acute osseus abnormalities.  IMPRESSION: Interval increase in size of persistent left basilar consolidative opacity worrisome for infection and/or aspiration though as this opacity has been present since the 07/2014 examination further evaluation with contrast-enhanced chest CT could be performed as clinically indicated.   Electronically Signed   By: Simonne Come M.D.   On: 01/24/2015 21:27     Management plans discussed with the patient, family and they are in agreement.  CODE STATUS:     Code Status Orders        Start     Ordered   01/25/15 0115  Do not attempt resuscitation (DNR)   Continuous    Question Answer Comment  In the event of cardiac or respiratory ARREST Do not call a "code blue"   In the event of cardiac or respiratory ARREST Do not perform Intubation, CPR, defibrillation or ACLS   In the event of cardiac or respiratory ARREST Use medication by any route, position, wound care, and other measures to relive pain and suffering. May use oxygen, suction and manual treatment of airway obstruction as needed for comfort.      01/25/15 0114      TOTAL TIME TAKING CARE OF THIS PATIENT: 40 minutes.    Tyrik Stetzer M.D on 01/26/2015 at 11:50 AM  Between 7am to 6pm - Pager - [phone] After 6pm go to [email or web address] - password [email or web address] [facility]  [facility]  Office  [phone]  CC: Primary care physician; Tillman Abide, MD

## 2015-01-28 ENCOUNTER — Telehealth: Payer: Self-pay | Admitting: *Deleted

## 2015-01-28 NOTE — Telephone Encounter (Signed)
Transition Care Management Follow-up Telephone Call   Date discharged? 01/26/15   How have you been since you were released from the hospital? Still with productive cough, but overall improved   Do you understand why you were in the hospital? yes   Do you understand the discharge instructions? yes   Where were you discharged to? Home   Items Reviewed:  Medications reviewed: yes  Allergies reviewed: yes  Dietary changes reviewed: no  Referrals reviewed: yes, PT   Functional Questionnaire:   Activities of Daily Living (ADLs):   He states they are independent in the following: feeding, continence and toileting States they require assistance with the following: ambulation, bathing and hygiene, grooming and dressing   Any transportation issues/concerns?: no   Any patient concerns? no   Confirmed importance and date/time of follow-up visits scheduled yes, 02/07/15 @ 1530  Provider Appointment booked with Tillman Abide, MD  Confirmed with patient if condition begins to worsen call PCP or go to the ER.  Patient was given the office number and encouraged to call back with question or concerns.  : yes

## 2015-01-28 NOTE — Telephone Encounter (Signed)
Transitional care call attempted.  Left message to return call. 

## 2015-01-29 LAB — CULTURE, BLOOD (ROUTINE X 2)
Culture: NO GROWTH
Culture: NO GROWTH

## 2015-01-29 NOTE — Telephone Encounter (Signed)
Glad to hear he made it home quickly I will discuss all issues of his care at the follow up

## 2015-01-30 DIAGNOSIS — I4891 Unspecified atrial fibrillation: Secondary | ICD-10-CM | POA: Diagnosis not present

## 2015-01-30 DIAGNOSIS — Z8673 Personal history of transient ischemic attack (TIA), and cerebral infarction without residual deficits: Secondary | ICD-10-CM | POA: Diagnosis not present

## 2015-01-30 DIAGNOSIS — J9621 Acute and chronic respiratory failure with hypoxia: Secondary | ICD-10-CM | POA: Diagnosis not present

## 2015-01-30 DIAGNOSIS — Z7901 Long term (current) use of anticoagulants: Secondary | ICD-10-CM | POA: Diagnosis not present

## 2015-01-30 DIAGNOSIS — E785 Hyperlipidemia, unspecified: Secondary | ICD-10-CM | POA: Diagnosis not present

## 2015-01-30 DIAGNOSIS — I10 Essential (primary) hypertension: Secondary | ICD-10-CM | POA: Diagnosis not present

## 2015-01-30 DIAGNOSIS — J449 Chronic obstructive pulmonary disease, unspecified: Secondary | ICD-10-CM | POA: Diagnosis not present

## 2015-01-30 DIAGNOSIS — Z9981 Dependence on supplemental oxygen: Secondary | ICD-10-CM | POA: Diagnosis not present

## 2015-02-04 DIAGNOSIS — Z7901 Long term (current) use of anticoagulants: Secondary | ICD-10-CM | POA: Diagnosis not present

## 2015-02-04 DIAGNOSIS — I10 Essential (primary) hypertension: Secondary | ICD-10-CM | POA: Diagnosis not present

## 2015-02-04 DIAGNOSIS — J449 Chronic obstructive pulmonary disease, unspecified: Secondary | ICD-10-CM | POA: Diagnosis not present

## 2015-02-04 DIAGNOSIS — E785 Hyperlipidemia, unspecified: Secondary | ICD-10-CM | POA: Diagnosis not present

## 2015-02-04 DIAGNOSIS — Z8673 Personal history of transient ischemic attack (TIA), and cerebral infarction without residual deficits: Secondary | ICD-10-CM | POA: Diagnosis not present

## 2015-02-04 DIAGNOSIS — J9621 Acute and chronic respiratory failure with hypoxia: Secondary | ICD-10-CM | POA: Diagnosis not present

## 2015-02-04 DIAGNOSIS — Z9981 Dependence on supplemental oxygen: Secondary | ICD-10-CM | POA: Diagnosis not present

## 2015-02-04 DIAGNOSIS — I4891 Unspecified atrial fibrillation: Secondary | ICD-10-CM | POA: Diagnosis not present

## 2015-02-06 DIAGNOSIS — I4891 Unspecified atrial fibrillation: Secondary | ICD-10-CM | POA: Diagnosis not present

## 2015-02-06 DIAGNOSIS — I10 Essential (primary) hypertension: Secondary | ICD-10-CM | POA: Diagnosis not present

## 2015-02-06 DIAGNOSIS — Z9981 Dependence on supplemental oxygen: Secondary | ICD-10-CM | POA: Diagnosis not present

## 2015-02-06 DIAGNOSIS — Z7901 Long term (current) use of anticoagulants: Secondary | ICD-10-CM | POA: Diagnosis not present

## 2015-02-06 DIAGNOSIS — J9621 Acute and chronic respiratory failure with hypoxia: Secondary | ICD-10-CM | POA: Diagnosis not present

## 2015-02-06 DIAGNOSIS — E785 Hyperlipidemia, unspecified: Secondary | ICD-10-CM | POA: Diagnosis not present

## 2015-02-06 DIAGNOSIS — Z8673 Personal history of transient ischemic attack (TIA), and cerebral infarction without residual deficits: Secondary | ICD-10-CM | POA: Diagnosis not present

## 2015-02-06 DIAGNOSIS — J449 Chronic obstructive pulmonary disease, unspecified: Secondary | ICD-10-CM | POA: Diagnosis not present

## 2015-02-07 ENCOUNTER — Ambulatory Visit (INDEPENDENT_AMBULATORY_CARE_PROVIDER_SITE_OTHER): Payer: Medicare Other | Admitting: Internal Medicine

## 2015-02-07 ENCOUNTER — Encounter: Payer: Self-pay | Admitting: Internal Medicine

## 2015-02-07 VITALS — BP 120/60 | HR 80 | Temp 97.6°F | Wt 132.0 lb

## 2015-02-07 DIAGNOSIS — J181 Lobar pneumonia, unspecified organism: Principal | ICD-10-CM

## 2015-02-07 DIAGNOSIS — J189 Pneumonia, unspecified organism: Secondary | ICD-10-CM | POA: Diagnosis not present

## 2015-02-07 DIAGNOSIS — I4819 Other persistent atrial fibrillation: Secondary | ICD-10-CM

## 2015-02-07 DIAGNOSIS — J439 Emphysema, unspecified: Secondary | ICD-10-CM | POA: Diagnosis not present

## 2015-02-07 DIAGNOSIS — I481 Persistent atrial fibrillation: Secondary | ICD-10-CM

## 2015-02-07 MED ORDER — TRAMADOL HCL 50 MG PO TABS: 50.0000 mg | ORAL_TABLET | Freq: Three times a day (TID) | ORAL | Status: DC | PRN

## 2015-02-07 MED ORDER — AMOXICILLIN-POT CLAVULANATE 875-125 MG PO TABS: 1.0000 | ORAL_TABLET | Freq: Two times a day (BID) | ORAL | Status: DC

## 2015-02-07 NOTE — Progress Notes (Signed)
Subjective:    Patient ID: [name] [name], male    DOB: 02/06/1921, 81 y.o.   MRN: [id number]  HPI Here with family for hospital follow up  Feels fairly good now Had gotten sick and shaky--so family brought him to hospital No fever Cough had worsened then--now back to a little mild cough Slight wheezing---if he is up a long time without oxygen Wears oxygen but not for getting to the bathroom or to table to eat  No chest pain No palpitations No edema  Current Outpatient Prescriptions on File Prior to Visit  Medication Sig Dispense Refill  . ADVAIR DISKUS 250-50 MCG/DOSE AEPB INHALE 1 PUFF BY MOUTH 2 TIMES A DAY. **RINSE MOUTH AFTER EACH USE** 60 each 11  . aspirin 81 MG tablet Take 81 mg by mouth every other day.     . Cholecalciferol (D3-1000) 1000 UNITS capsule Take 1,000 Units by mouth daily.    [name] diltiazem (CARDIZEM) 120 MG tablet TAKE 1 TABLET BY MOUTH ONCE A DAY 30 tablet 10  . furosemide (LASIX) 20 MG tablet TAKE 1 TABLET BY MOUTH ONCE A DAY 90 tablet 3  . mirtazapine (REMERON) 15 MG tablet Take 0.5 tablets (7.5 mg total) by mouth at bedtime. 30 tablet 11  . polyethylene glycol (MIRALAX / GLYCOLAX) packet Take 17 g by mouth daily.    . traMADol (ULTRAM) 50 MG tablet Take 1 tablet (50 mg total) by mouth 3 (three) times daily as needed. 90 tablet 0  . XARELTO 20 MG TABS tablet TAKE 1 TABLET BY MOUTH EVERY DAY WITH SUPPER 30 tablet 11   No current facility-administered medications on file prior to visit.    Allergies  Allergen Reactions  . No Known Allergies     Past Medical History  Diagnosis Date  . Cataract     2004- Cataract removal, 2007 Right eye cataract  . COPD (chronic obstructive pulmonary disease)   . Gastric ulcer     1977- Gastric Ulcers with hemorrhage and surgical repair  . Atrial fibrillation   . Hypertension   . Hyperlipidemia   . Stroke   . Pneumonia   . Fracture of femoral neck, right     Past Surgical History  Procedure Laterality Date  .  Laceration repair      1983- laceration right wrist   . Carotid endarterectomy  214    right===Dr Dew  . Right hip hemiarthroplasty    . Fracture surgery Right 2014    hip    Family History  Problem Relation Age of Onset  . Stroke Mother   . Cancer Neg Hx     History   Social History  . Marital Status: Widowed    Spouse Name: N/A  . Number of Children: 3  . Years of Education: N/A   Occupational History  . retired Scientist, product/process development    Social History Main Topics  . Smoking status: Former Smoker    Quit date: 03/07/2011  . Smokeless tobacco: Never Used  . Alcohol Use: No  . Drug Use: No  . Sexual Activity: No   Other Topics Concern  . Not on file   Social History Narrative   Lives with oldest daughter   Jamesetta So should make decisions for him   Has DNR--reviewed and he still wants this         Review of Systems Appetite is not so good---did eat a lot while on the prednisone Some right leg pain    Objective:  Physical Exam  Constitutional: He appears well-developed. No distress.  Neck: Normal range of motion. No thyromegaly present.  Cardiovascular:  Irregular   Pulmonary/Chest:  Decreased breath sounds left base Slight wheezing  Musculoskeletal: He exhibits no edema.  Lymphadenopathy:    He has no cervical adenopathy.          Assessment & Plan:

## 2015-02-07 NOTE — Assessment & Plan Note (Signed)
Persistent abnormal area with clinical signs of pneumonia Better now I expect recurrences, so will provide augmentin to start with the onset of any symptoms

## 2015-02-07 NOTE — Assessment & Plan Note (Signed)
Needs oxygen all the time--will try to get portable unit as a long cord will increase fall risk---he is likely to trip on it He also needs it when going out and a tank is too hard to carry advair too much money--will try decreasing to once a day

## 2015-02-07 NOTE — Progress Notes (Signed)
Pre visit review using our clinic review tool, if applicable. No additional management support is needed unless otherwise documented below in the visit note. 

## 2015-02-07 NOTE — Assessment & Plan Note (Signed)
Good rate control Needs to continue the xarelto

## 2015-02-11 ENCOUNTER — Other Ambulatory Visit: Payer: Self-pay | Admitting: *Deleted

## 2015-02-11 ENCOUNTER — Other Ambulatory Visit: Payer: Self-pay | Admitting: Internal Medicine

## 2015-02-11 DIAGNOSIS — J439 Emphysema, unspecified: Secondary | ICD-10-CM

## 2015-02-11 DIAGNOSIS — J9621 Acute and chronic respiratory failure with hypoxia: Secondary | ICD-10-CM

## 2015-02-12 DIAGNOSIS — I4891 Unspecified atrial fibrillation: Secondary | ICD-10-CM | POA: Diagnosis not present

## 2015-02-12 DIAGNOSIS — J9621 Acute and chronic respiratory failure with hypoxia: Secondary | ICD-10-CM | POA: Diagnosis not present

## 2015-02-12 DIAGNOSIS — Z9981 Dependence on supplemental oxygen: Secondary | ICD-10-CM | POA: Diagnosis not present

## 2015-02-12 DIAGNOSIS — Z8673 Personal history of transient ischemic attack (TIA), and cerebral infarction without residual deficits: Secondary | ICD-10-CM | POA: Diagnosis not present

## 2015-02-12 DIAGNOSIS — J449 Chronic obstructive pulmonary disease, unspecified: Secondary | ICD-10-CM | POA: Diagnosis not present

## 2015-02-12 DIAGNOSIS — I10 Essential (primary) hypertension: Secondary | ICD-10-CM | POA: Diagnosis not present

## 2015-02-12 DIAGNOSIS — Z7901 Long term (current) use of anticoagulants: Secondary | ICD-10-CM | POA: Diagnosis not present

## 2015-02-12 DIAGNOSIS — E785 Hyperlipidemia, unspecified: Secondary | ICD-10-CM | POA: Diagnosis not present

## 2015-02-13 DIAGNOSIS — J449 Chronic obstructive pulmonary disease, unspecified: Secondary | ICD-10-CM | POA: Diagnosis not present

## 2015-02-13 DIAGNOSIS — J9621 Acute and chronic respiratory failure with hypoxia: Secondary | ICD-10-CM | POA: Diagnosis not present

## 2015-02-13 DIAGNOSIS — I10 Essential (primary) hypertension: Secondary | ICD-10-CM | POA: Diagnosis not present

## 2015-02-13 DIAGNOSIS — I4891 Unspecified atrial fibrillation: Secondary | ICD-10-CM | POA: Diagnosis not present

## 2015-02-14 DIAGNOSIS — Z8673 Personal history of transient ischemic attack (TIA), and cerebral infarction without residual deficits: Secondary | ICD-10-CM | POA: Diagnosis not present

## 2015-02-14 DIAGNOSIS — Z7901 Long term (current) use of anticoagulants: Secondary | ICD-10-CM | POA: Diagnosis not present

## 2015-02-14 DIAGNOSIS — E785 Hyperlipidemia, unspecified: Secondary | ICD-10-CM | POA: Diagnosis not present

## 2015-02-14 DIAGNOSIS — I4891 Unspecified atrial fibrillation: Secondary | ICD-10-CM | POA: Diagnosis not present

## 2015-02-14 DIAGNOSIS — Z9981 Dependence on supplemental oxygen: Secondary | ICD-10-CM | POA: Diagnosis not present

## 2015-02-14 DIAGNOSIS — J9621 Acute and chronic respiratory failure with hypoxia: Secondary | ICD-10-CM | POA: Diagnosis not present

## 2015-02-14 DIAGNOSIS — I10 Essential (primary) hypertension: Secondary | ICD-10-CM | POA: Diagnosis not present

## 2015-02-14 DIAGNOSIS — J449 Chronic obstructive pulmonary disease, unspecified: Secondary | ICD-10-CM | POA: Diagnosis not present

## 2015-02-18 DIAGNOSIS — Z9981 Dependence on supplemental oxygen: Secondary | ICD-10-CM | POA: Diagnosis not present

## 2015-02-18 DIAGNOSIS — I10 Essential (primary) hypertension: Secondary | ICD-10-CM | POA: Diagnosis not present

## 2015-02-18 DIAGNOSIS — E785 Hyperlipidemia, unspecified: Secondary | ICD-10-CM | POA: Diagnosis not present

## 2015-02-18 DIAGNOSIS — J449 Chronic obstructive pulmonary disease, unspecified: Secondary | ICD-10-CM | POA: Diagnosis not present

## 2015-02-18 DIAGNOSIS — Z8673 Personal history of transient ischemic attack (TIA), and cerebral infarction without residual deficits: Secondary | ICD-10-CM | POA: Diagnosis not present

## 2015-02-18 DIAGNOSIS — I4891 Unspecified atrial fibrillation: Secondary | ICD-10-CM | POA: Diagnosis not present

## 2015-02-18 DIAGNOSIS — Z7901 Long term (current) use of anticoagulants: Secondary | ICD-10-CM | POA: Diagnosis not present

## 2015-02-18 DIAGNOSIS — J9621 Acute and chronic respiratory failure with hypoxia: Secondary | ICD-10-CM | POA: Diagnosis not present

## 2015-02-25 ENCOUNTER — Observation Stay
Admission: EM | Admit: 2015-02-25 | Discharge: 2015-02-28 | Disposition: A | Discharge status: Home / Self Care | Payer: Medicare Other | Attending: Internal Medicine | Admitting: Internal Medicine

## 2015-02-25 ENCOUNTER — Encounter: Payer: Self-pay | Admitting: Emergency Medicine

## 2015-02-25 ENCOUNTER — Emergency Department: Payer: Medicare Other

## 2015-02-25 ENCOUNTER — Telehealth: Payer: Self-pay | Admitting: Internal Medicine

## 2015-02-25 DIAGNOSIS — J410 Simple chronic bronchitis: Secondary | ICD-10-CM | POA: Diagnosis not present

## 2015-02-25 DIAGNOSIS — I10 Essential (primary) hypertension: Secondary | ICD-10-CM | POA: Diagnosis not present

## 2015-02-25 DIAGNOSIS — L8990 Pressure ulcer of unspecified site, unspecified stage: Secondary | ICD-10-CM | POA: Insufficient documentation

## 2015-02-25 DIAGNOSIS — R042 Hemoptysis: Secondary | ICD-10-CM | POA: Diagnosis not present

## 2015-02-25 DIAGNOSIS — F0150 Vascular dementia without behavioral disturbance: Secondary | ICD-10-CM | POA: Insufficient documentation

## 2015-02-25 DIAGNOSIS — E860 Dehydration: Secondary | ICD-10-CM | POA: Diagnosis not present

## 2015-02-25 DIAGNOSIS — Z8673 Personal history of transient ischemic attack (TIA), and cerebral infarction without residual deficits: Secondary | ICD-10-CM | POA: Diagnosis not present

## 2015-02-25 DIAGNOSIS — Z7951 Long term (current) use of inhaled steroids: Secondary | ICD-10-CM | POA: Insufficient documentation

## 2015-02-25 DIAGNOSIS — Z681 Body mass index (BMI) 19 or less, adult: Secondary | ICD-10-CM | POA: Diagnosis not present

## 2015-02-25 DIAGNOSIS — Z7901 Long term (current) use of anticoagulants: Secondary | ICD-10-CM | POA: Insufficient documentation

## 2015-02-25 DIAGNOSIS — R4182 Altered mental status, unspecified: Secondary | ICD-10-CM | POA: Diagnosis not present

## 2015-02-25 DIAGNOSIS — I4891 Unspecified atrial fibrillation: Secondary | ICD-10-CM | POA: Diagnosis not present

## 2015-02-25 DIAGNOSIS — K567 Ileus, unspecified: Secondary | ICD-10-CM | POA: Diagnosis not present

## 2015-02-25 DIAGNOSIS — E785 Hyperlipidemia, unspecified: Secondary | ICD-10-CM | POA: Diagnosis not present

## 2015-02-25 DIAGNOSIS — Z66 Do not resuscitate: Secondary | ICD-10-CM | POA: Diagnosis not present

## 2015-02-25 DIAGNOSIS — R531 Weakness: Secondary | ICD-10-CM | POA: Insufficient documentation

## 2015-02-25 DIAGNOSIS — E43 Unspecified severe protein-calorie malnutrition: Secondary | ICD-10-CM | POA: Insufficient documentation

## 2015-02-25 DIAGNOSIS — J90 Pleural effusion, not elsewhere classified: Secondary | ICD-10-CM | POA: Diagnosis not present

## 2015-02-25 DIAGNOSIS — E784 Other hyperlipidemia: Secondary | ICD-10-CM | POA: Diagnosis not present

## 2015-02-25 DIAGNOSIS — Z87891 Personal history of nicotine dependence: Secondary | ICD-10-CM | POA: Insufficient documentation

## 2015-02-25 DIAGNOSIS — Z7982 Long term (current) use of aspirin: Secondary | ICD-10-CM | POA: Diagnosis not present

## 2015-02-25 DIAGNOSIS — Z96641 Presence of right artificial hip joint: Secondary | ICD-10-CM | POA: Diagnosis not present

## 2015-02-25 DIAGNOSIS — R451 Restlessness and agitation: Secondary | ICD-10-CM | POA: Insufficient documentation

## 2015-02-25 DIAGNOSIS — K5900 Constipation, unspecified: Secondary | ICD-10-CM | POA: Diagnosis not present

## 2015-02-25 DIAGNOSIS — G9340 Encephalopathy, unspecified: Secondary | ICD-10-CM | POA: Diagnosis not present

## 2015-02-25 DIAGNOSIS — G9389 Other specified disorders of brain: Secondary | ICD-10-CM | POA: Diagnosis not present

## 2015-02-25 DIAGNOSIS — J449 Chronic obstructive pulmonary disease, unspecified: Secondary | ICD-10-CM | POA: Diagnosis not present

## 2015-02-25 DIAGNOSIS — J9811 Atelectasis: Secondary | ICD-10-CM | POA: Diagnosis not present

## 2015-02-25 DIAGNOSIS — E875 Hyperkalemia: Secondary | ICD-10-CM | POA: Diagnosis not present

## 2015-02-25 DIAGNOSIS — Z79899 Other long term (current) drug therapy: Secondary | ICD-10-CM | POA: Diagnosis not present

## 2015-02-25 LAB — COMPREHENSIVE METABOLIC PANEL
ALT: 9 U/L — ABNORMAL LOW (ref 17–63)
ANION GAP: 7 (ref 5–15)
AST: 17 U/L (ref 15–41)
Albumin: 3.1 g/dL — ABNORMAL LOW (ref 3.5–5.0)
Alkaline Phosphatase: 116 U/L (ref 38–126)
BILIRUBIN TOTAL: 0.6 mg/dL (ref 0.3–1.2)
BUN: 11 mg/dL (ref 6–20)
CO2: 27 mmol/L (ref 22–32)
Calcium: 8.4 mg/dL — ABNORMAL LOW (ref 8.9–10.3)
Chloride: 103 mmol/L (ref 101–111)
Creatinine, Ser: 0.71 mg/dL (ref 0.61–1.24)
Glucose, Bld: 98 mg/dL (ref 65–99)
POTASSIUM: 4.2 mmol/L (ref 3.5–5.1)
Sodium: 137 mmol/L (ref 135–145)
TOTAL PROTEIN: 7.7 g/dL (ref 6.5–8.1)

## 2015-02-25 LAB — CBC
HEMATOCRIT: 31.8 % — AB (ref 40.0–52.0)
HEMOGLOBIN: 10.5 g/dL — AB (ref 13.0–18.0)
MCH: 29.5 pg (ref 26.0–34.0)
MCHC: 33.1 g/dL (ref 32.0–36.0)
MCV: 89 fL (ref 80.0–100.0)
Platelets: 239 10*3/uL (ref 150–440)
RBC: 3.57 MIL/uL — ABNORMAL LOW (ref 4.40–5.90)
RDW: 17.1 % — ABNORMAL HIGH (ref 11.5–14.5)
WBC: 7.6 10*3/uL (ref 3.8–10.6)

## 2015-02-25 LAB — URINALYSIS COMPLETE WITH MICROSCOPIC (ARMC ONLY)
BILIRUBIN URINE: NEGATIVE
Bacteria, UA: NONE SEEN
Glucose, UA: NEGATIVE mg/dL
KETONES UR: NEGATIVE mg/dL
Leukocytes, UA: NEGATIVE
NITRITE: NEGATIVE
PH: 6 (ref 5.0–8.0)
PROTEIN: NEGATIVE mg/dL
SPECIFIC GRAVITY, URINE: 1.012 (ref 1.005–1.030)

## 2015-02-25 LAB — GLUCOSE, CAPILLARY: Glucose-Capillary: 108 mg/dL — ABNORMAL HIGH (ref 65–99)

## 2015-02-25 MED ORDER — ONDANSETRON HCL 4 MG PO TABS: 4.0000 mg | ORAL_TABLET | Freq: Four times a day (QID) | ORAL | Status: DC | PRN

## 2015-02-25 MED ORDER — DILTIAZEM HCL ER COATED BEADS 120 MG PO CP24
120.0000 mg | ORAL_CAPSULE | Freq: Every day | ORAL | Status: DC
  Administered 2015-02-27: 120 mg via ORAL
  Filled 2015-02-25 (×2): qty 1

## 2015-02-25 MED ORDER — MOMETASONE FURO-FORMOTEROL FUM 100-5 MCG/ACT IN AERO
2.0000 | INHALATION_SPRAY | Freq: Two times a day (BID) | RESPIRATORY_TRACT | Status: DC
  Administered 2015-02-26 – 2015-02-27 (×4): 2 via RESPIRATORY_TRACT
  Filled 2015-02-25: qty 8.8

## 2015-02-25 MED ORDER — ASPIRIN EC 81 MG PO TBEC
81.0000 mg | DELAYED_RELEASE_TABLET | ORAL | Status: DC
  Filled 2015-02-25: qty 1

## 2015-02-25 MED ORDER — TRAMADOL HCL 50 MG PO TABS: 50.0000 mg | ORAL_TABLET | Freq: Three times a day (TID) | ORAL | Status: DC | PRN

## 2015-02-25 MED ORDER — RIVAROXABAN 15 MG PO TABS
15.0000 mg | ORAL_TABLET | Freq: Every day | ORAL | Status: DC
  Administered 2015-02-27: 15 mg via ORAL
  Filled 2015-02-25 (×2): qty 1

## 2015-02-25 MED ORDER — SODIUM CHLORIDE 0.9 % IJ SOLN
3.0000 mL | Freq: Two times a day (BID) | INTRAMUSCULAR | Status: DC
  Administered 2015-02-26: 3 mL via INTRAVENOUS

## 2015-02-25 MED ORDER — ONDANSETRON HCL 4 MG/2ML IJ SOLN: 4.0000 mg | Freq: Four times a day (QID) | INTRAMUSCULAR | Status: DC | PRN

## 2015-02-25 MED ORDER — RIVAROXABAN 20 MG PO TABS: 20.0000 mg | ORAL_TABLET | Freq: Every evening | ORAL | Status: DC

## 2015-02-25 MED ORDER — ACETAMINOPHEN 650 MG RE SUPP: 650.0000 mg | Freq: Four times a day (QID) | RECTAL | Status: DC | PRN

## 2015-02-25 MED ORDER — SODIUM CHLORIDE 0.9 % IV SOLN
INTRAVENOUS | Status: DC
  Administered 2015-02-26: via INTRAVENOUS

## 2015-02-25 MED ORDER — IOHEXOL 300 MG/ML  SOLN
75.0000 mL | Freq: Once | INTRAMUSCULAR | Status: AC | PRN
  Administered 2015-02-25: 75 mL via INTRAVENOUS

## 2015-02-25 MED ORDER — MIRTAZAPINE 15 MG PO TABS
7.5000 mg | ORAL_TABLET | Freq: Every day | ORAL | Status: DC
Start: 2015-02-25 — End: 2015-02-28
  Administered 2015-02-26 – 2015-02-27 (×3): 7.5 mg via ORAL
  Filled 2015-02-25 (×3): qty 1

## 2015-02-25 MED ORDER — ACETAMINOPHEN 325 MG PO TABS
650.0000 mg | ORAL_TABLET | Freq: Four times a day (QID) | ORAL | Status: DC | PRN
Start: 2015-02-25 — End: 2015-02-28

## 2015-02-25 MED ORDER — DILTIAZEM HCL 60 MG PO TABS
120.0000 mg | ORAL_TABLET | Freq: Every day | ORAL | Status: DC
  Filled 2015-02-25: qty 2

## 2015-02-25 NOTE — H&P (Signed)
[facility] [place] [facility]   PATIENT NAME: [name] [name]    MR#:  [id number]  DATE OF BIRTH:  11-18-1920  DATE OF ADMISSION:  02/25/2015  PRIMARY CARE PHYSICIAN: Tillman Abide, MD   REQUESTING/REFERRING PHYSICIAN: Darnelle Catalan, MD  CHIEF COMPLAINT:   Chief Complaint  Patient presents with  . Altered Mental Status    HISTORY OF PRESENT ILLNESS:  [name] [name]  is a 81 y.o. male who presents with altered mental status. Family reports that for the past 2 days the patient has been having more significant episodes of confusion. They brought him in the ED be evaluated for this. Workup here is largely benign except for abdominal x-ray which shows significant constipation and some ileus. On this writer's interview the patient is alert and oriented and following all commands, but does state that he has not had a bowel movement for 5 days, and that he has had some significant abdominal pain and he tries to have a bowel movement.  PAST MEDICAL HISTORY:   Past Medical History  Diagnosis Date  . Cataract     2004- Cataract removal, 2007 Right eye cataract  . COPD (chronic obstructive pulmonary disease)   . Gastric ulcer     1977- Gastric Ulcers with hemorrhage and surgical repair  . Atrial fibrillation   . Hypertension   . Hyperlipidemia   . Stroke   . Pneumonia   . Fracture of femoral neck, right     PAST SURGICAL HISTORY:   Past Surgical History  Procedure Laterality Date  . Laceration repair      1983- laceration right wrist   . Carotid endarterectomy  214    right===Dr Dew  . Right hip hemiarthroplasty    . Fracture surgery Right 2014    hip    SOCIAL HISTORY:   Social History  Substance Use Topics  . Smoking status: Former Smoker    Quit date: 03/07/2011  . Smokeless tobacco: Never Used  . Alcohol Use: No    FAMILY HISTORY:   Family History  Problem Relation Age of Onset  . Stroke Mother   . Cancer Neg Hx     DRUG ALLERGIES:   No Known Allergies  MEDICATIONS AT HOME:   Prior to Admission medications   Medication Sig Start Date End Date Taking? Authorizing Provider  amoxicillin-clavulanate (AUGMENTIN) 875-125 MG per tablet Take 1 tablet by mouth 2 (two) times daily. 02/07/15  Yes Karie Schwalbe, MD  aspirin EC 81 MG tablet Take 81 mg by mouth every other day.   Yes Historical Provider, MD  Cholecalciferol (VITAMIN D3) 2000 UNITS capsule Take 2,000 Units by mouth daily.   Yes Historical Provider, MD  diltiazem (CARDIZEM) 120 MG tablet Take 120 mg by mouth daily.   Yes Historical Provider, MD  Fluticasone-Salmeterol (ADVAIR) 250-50 MCG/DOSE AEPB Inhale 1 puff into the lungs 2 (two) times daily.   Yes Historical Provider, MD  furosemide (LASIX) 20 MG tablet Take 20 mg by mouth daily.   Yes Historical Provider, MD  mirtazapine (REMERON) 15 MG tablet Take 0.5 tablets (7.5 mg total) by mouth at bedtime. 09/05/14  Yes Karie Schwalbe, MD  rivaroxaban (XARELTO) 20 MG TABS tablet Take 20 mg by mouth every evening.   Yes Historical Provider, MD  traMADol (ULTRAM) 50 MG tablet Take 1 tablet (50 mg total) by mouth 3 (three) times daily as needed. Patient taking differently: Take 50 mg by mouth 3 (three) times daily as needed for  moderate pain.  02/07/15  Yes Karie Schwalbe, MD    REVIEW OF SYSTEMS:  Review of Systems  Constitutional: Negative for fever, chills, weight loss and malaise/fatigue.  HENT: Negative for ear pain, hearing loss and tinnitus.   Eyes: Negative for blurred vision, double vision, pain and redness.  Respiratory: Negative for cough, hemoptysis and shortness of breath.   Cardiovascular: Negative for chest pain, palpitations, orthopnea and leg swelling.  Gastrointestinal: Positive for abdominal pain and constipation. Negative for nausea, vomiting and diarrhea.  Genitourinary: Negative for dysuria, frequency and hematuria.  Musculoskeletal: Negative for back pain, joint pain and neck pain.  Skin:       No  acne, rash, or lesions  Neurological: Negative for dizziness, tremors, focal weakness and weakness.  Endo/Heme/Allergies: Negative for polydipsia. Does not bruise/bleed easily.  Psychiatric/Behavioral: Negative for depression. The patient is not nervous/anxious and does not have insomnia.      VITAL SIGNS:   Filed Vitals:   02/25/15 1538 02/25/15 1800 02/25/15 1900 02/25/15 2104  BP: 129/58 134/78 121/84 131/96  Pulse: 84 104 81   Temp: 98.1 F (36.7 C)     TempSrc: Oral     Resp: Height: 6' (1.829 m)     Weight: 55.792 kg (123 lb)     SpO2: 98% 95% 99%    Wt Readings from Last 3 Encounters:  02/25/15 55.792 kg (123 lb)  02/07/15 59.875 kg (132 lb)  01/26/15 60.011 kg (132 lb 4.8 oz)    PHYSICAL EXAMINATION:  Physical Exam  Vitals reviewed. Constitutional: He is oriented to person, place, and time. He appears well-developed and well-nourished. No distress.  HENT:  Head: Normocephalic and atraumatic.  Mouth/Throat: Oropharynx is clear and moist.  Eyes: Conjunctivae and EOM are normal. Pupils are equal, round, and reactive to light. No scleral icterus.  Neck: Normal range of motion. Neck supple. No JVD present. No thyromegaly present.  Cardiovascular: Normal rate, regular rhythm and intact distal pulses.  Exam reveals no gallop and no friction rub.   No murmur heard. Respiratory: Effort normal and breath sounds normal. No respiratory distress. He has no wheezes. He has no rales.  GI: Soft. He exhibits no distension. There is no tenderness.  Hypoactive bowel sounds  Musculoskeletal: Normal range of motion. He exhibits no edema.  No arthritis, no gout  Lymphadenopathy:    He has no cervical adenopathy.  Neurological: He is alert and oriented to person, place, and time. No cranial nerve deficit.  No dysarthria, no aphasia  Skin: Skin is warm and dry. No rash noted. No erythema.  Psychiatric: He has a normal mood and affect. His behavior is normal. Judgment and  thought content normal.    LABORATORY PANEL:   CBC  Recent Labs Lab 02/25/15 1616  WBC 7.6  HGB 10.5*  HCT 31.8*  PLT 239   ------------------------------------------------------------------------------------------------------------------  Chemistries   Recent Labs Lab 02/25/15 1616  NA 137  K 4.2  CL 103  CO2 27  GLUCOSE 98  BUN 11  CREATININE 0.71  CALCIUM 8.4*  AST 17  ALT 9*  ALKPHOS 116  BILITOT 0.6   ------------------------------------------------------------------------------------------------------------------  Cardiac Enzymes No results for input(s): TROPONINI in the last 168 hours. ------------------------------------------------------------------------------------------------------------------  RADIOLOGY:  Dg Chest 2 View  02/25/2015   CLINICAL DATA:  Acting different past couple days, not himself, on oxygen at home, COPD, hypertension, atrial fibrillation, prior stroke  EXAM: CHEST  2 VIEW  COMPARISON:  01/24/2015  FINDINGS: Upper normal heart size.  Atherosclerotic calcification aorta.  Mediastinal contours and pulmonary vascularity normal.  Emphysematous changes consistent with COPD.  Moderate LEFT and tiny RIGHT pleural effusions with LEFT basilar atelectasis.  Remaining lungs clear.  No pneumothorax.  Bones demineralized.  IMPRESSION: COPD changes with moderate LEFT and tiny RIGHT pleural effusions and LEFT basilar atelectasis.   Electronically Signed   By: Ulyses Southward M.D.   On: 02/25/2015 17:41   Dg Abd 1 View  02/25/2015   CLINICAL DATA:  Altered mental status. Weakness. Apparent large loop of bowel seen on chest x-ray.  EXAM: ABDOMEN - 1 VIEW  COMPARISON:  Chest 02/25/2015  FINDINGS: Prominent gas and stool-filled bowel loop, mostly in the ascending and transverse colon. Findings likely to be due to constipation and ileus. No small bowel distention. Surgical clips in the upper abdomen. No radiopaque stones. Degenerative changes in the spine and left  hip. Right hip arthroplasty. Vascular calcifications.  IMPRESSION: Prominent gas and stool in the colon likely to be due to constipation and ileus.   Electronically Signed   By: Burman Nieves M.D.   On: 02/25/2015 18:56   Ct Head Wo Contrast  02/25/2015   CLINICAL DATA:  Altered mental status  EXAM: CT HEAD WITHOUT CONTRAST  TECHNIQUE: Contiguous axial images were obtained from the base of the skull through the vertex without intravenous contrast.  COMPARISON:  06/30/2012  FINDINGS: There is prominence of the sulci and ventricles consistent with brain atrophy. Right frontal lobe encephalomalacia is identified, image number 20/series 2. There is low attenuation within the subcortical and periventricular white matter consistent with chronic microvascular disease. There is no evidence for acute brain infarct, intracranial hemorrhage or mass. The paranasal sinuses and mastoid air cells are clear. The calvarium is intact.  IMPRESSION: 1. No acute intracranial abnormalities. 2. Right frontal lobe encephalomalacia compatible with previous infarct. 3. Chronic microvascular disease and brain atrophy.   Electronically Signed   By: Signa Kell M.D.   On: 02/25/2015 19:29   Ct Chest W Contrast  02/25/2015   CLINICAL DATA:  Cough.  EXAM: CT CHEST WITH CONTRAST  TECHNIQUE: Multidetector CT imaging of the chest was performed during intravenous contrast administration.  CONTRAST:  75mL OMNIPAQUE IOHEXOL 300 MG/ML  SOLN  COMPARISON:  CT scan of June 09, 2012. Radiographs of September 27, 2014.  FINDINGS: Stable old compression fracture of mid thoracic vertebral body is noted. No pneumothorax is noted. Moderate left pleural effusion is noted with atelectasis of the left lower lobe. Mild subsegmental atelectasis is noted posteriorly in the left upper lobe. Mild right pleural effusion is noted with adjacent subsegmental atelectasis. Mild emphysematous changes noted in both lungs. Stable biapical scarring is noted.  Atherosclerosis of thoracic aorta is noted without aneurysm or dissection. Visualized portions of pulmonary arteries appear normal. No mediastinal mass or adenopathy is noted. Visualized portion of upper abdomen is unremarkable.  IMPRESSION: Moderate left pleural effusion is noted with atelectasis of the left lower lobe. Mild right pleural effusion is noted with adjacent subsegmental atelectasis.   Electronically Signed   By: Lupita Raider, M.D.   On: 02/25/2015 19:44    EKG:   Orders placed or performed during the hospital encounter of 02/25/15  . ED EKG  . ED EKG    IMPRESSION AND PLAN:  Principal Problem:   Ileus - likely secondary to severe constipation. Without nausea and vomiting or significant persistent abdominal pain the patient likely does not need gastric decompression  with NG tube at this time. We will start with an enema and see if we can resolve his constipation. Active Problems:   COPD (chronic obstructive pulmonary disease) - currently stable, continue home meds   Hypertension - controlled, continue home meds   Hyperlipidemia - continue home meds   A-fib - anticoagulated with Xarelto, continue this here   Vascular dementia without behavioral disturbance - continue home medications for this  All the records are reviewed and case discussed with ED provider. Management plans discussed with the patient and/or family.  DVT PROPHYLAXIS: systemic anticoagulation  ADMISSION STATUS: Observation  CODE STATUS: DNR  TOTAL TIME TAKING CARE OF THIS PATIENT: 30 minutes.    Layanna Charo FIELDING 02/25/2015, 9:51 PM  [facility]  Office  [phone]  CC: Primary care physician; Tillman Abide, MD

## 2015-02-25 NOTE — ED Notes (Signed)
Assumed pt care at this time. NAD noted. RR even and nonlabored. Family at bedside. Pt transported to and from CT via stretcher without incident. Bed in lowest position and call bell left within reach. Will continue to monitor.

## 2015-02-25 NOTE — ED Notes (Signed)
Admitting MD at bedside for eval.

## 2015-02-25 NOTE — ED Notes (Signed)
Assisted pt with urinal. Family remains at bedside. No other needs at this time. NAD noted. RR even and nonlabored. Will continue to monitor. Pt and pt's family updated on plan of care.

## 2015-02-25 NOTE — Telephone Encounter (Signed)
Patient Name: [name] [name]  DOB: 1920/11/15    Initial Comment caller states grandfather is having confusion and shaking   Nurse Assessment  Nurse: Scarlette Ar, RN, Heather Date/Time (Eastern Time): 02/25/2015 2:40:13 PM  Confirm and document reason for call. If symptomatic, describe symptoms. ---Caller states that her grandfather started with some shakiness and some confusion, it started about 2 days ago. His O2 level was jumping up and down yesterday  Has the patient traveled out of the country within the last 30 days? ---Not Applicable  Does the patient require triage? ---Yes  Related visit to physician within the last 2 weeks? ---No  Does the PT have any chronic conditions? (i.e. diabetes, asthma, etc.) ---Yes  List chronic conditions. ---COPD, HTN, A-fib, stroke     Guidelines    Guideline Title Affirmed Question Affirmed Notes  Confusion - Delirium Patient sounds very sick or weak to the triager    Final Disposition User   Go to ED Now (or PCP triage) Scarlette Ar, RN, [employer]    Referrals  [facility] - ED  [facility] - ED   Disagree/Comply: Comply

## 2015-02-25 NOTE — ED Notes (Signed)
Assisted pt to stand on the side of the bed to obtain a urine sample, unable to urinate at this time. Urinal at the bedside.

## 2015-02-25 NOTE — Telephone Encounter (Signed)
Will follow up on what happens in the ER

## 2015-02-25 NOTE — ED Provider Notes (Addendum)
[facility] Emergency Department Provider Note  ____________________________________________  Time seen: Approximately 6:36 PM  I have reviewed the triage vital signs and the nursing notes.   HISTORY  Chief Complaint Altered Mental Status  History supplied by daughter patient's confused  HPI [name] [name] is a 81 y.o. male *report patient began shaking and talking out of his head today he did this last time he had pneumonia. Patient does have a occasional wet cough. She is not coughing anything up. Patient does not appear to have a fever. Patient's oxygen saturation dropped little bit so daughter increased his oxygen somewhat. No other history is available. Past medical history is below.   Past Medical History  Diagnosis Date  . Cataract     2004- Cataract removal, 2007 Right eye cataract  . COPD (chronic obstructive pulmonary disease)   . Gastric ulcer     1977- Gastric Ulcers with hemorrhage and surgical repair  . Atrial fibrillation   . Hypertension   . Hyperlipidemia   . Stroke   . Pneumonia   . Fracture of femoral neck, right     Patient Active Problem List   Diagnosis Date Noted  . Acute on chronic respiratory failure with hypoxemia 01/24/2015  . Lumbar compression fracture 09/27/2014  . Abnormal chest x-ray with multiple lung nodules 09/17/2014  . LLL pneumonia 08/01/2014  . Preventative health care 07/11/2014  . Vascular dementia without behavioral disturbance 07/11/2014  . Mild malnutrition 07/11/2014  . Advanced directives, counseling/discussion 07/11/2014  . Actinic keratosis 06/20/2013  . Anemia 02/06/2013  . A-fib 02/02/2013  . Unspecified constipation 02/02/2013  . Urinary hesitancy 02/02/2013  . Osteoporosis, unspecified 02/02/2013  . TIA (transient ischemic attack) 07/05/2012  . Hyperlipidemia   . COPD exacerbation 06/15/2012  . Hypertension   . COPD (chronic obstructive pulmonary disease) 07/19/2009    Past Surgical  History  Procedure Laterality Date  . Laceration repair      1983- laceration right wrist   . Carotid endarterectomy  214    right===Dr Dew  . Right hip hemiarthroplasty    . Fracture surgery Right 2014    hip    Current Outpatient Rx  Name  Route  Sig  Dispense  Refill  . ADVAIR DISKUS 250-50 MCG/DOSE AEPB      INHALE 1 PUFF BY MOUTH 2 TIMES A DAY. **RINSE MOUTH AFTER EACH USE**   60 each   11   . amoxicillin-clavulanate (AUGMENTIN) 875-125 MG per tablet   Oral   Take 1 tablet by mouth 2 (two) times daily.   20 tablet   0   . aspirin 81 MG tablet   Oral   Take 81 mg by mouth every other day.          . Cholecalciferol (D3-1000) 1000 UNITS capsule   Oral   Take 1,000 Units by mouth daily.         [name] diltiazem (CARDIZEM) 120 MG tablet      TAKE 1 TABLET BY MOUTH ONCE A DAY   30 tablet   10   . furosemide (LASIX) 20 MG tablet      TAKE 1 TABLET BY MOUTH ONCE A DAY   90 tablet   3   . mirtazapine (REMERON) 15 MG tablet   Oral   Take 0.5 tablets (7.5 mg total) by mouth at bedtime.   30 tablet   11     Increase to full tab after 2 weeks   . polyethylene glycol (  MIRALAX / GLYCOLAX) packet   Oral   Take 17 g by mouth daily.         . traMADol (ULTRAM) 50 MG tablet   Oral   Take 1 tablet (50 mg total) by mouth 3 (three) times daily as needed.   90 tablet   0   . XARELTO 20 MG TABS tablet      TAKE 1 TABLET BY MOUTH EVERY DAY WITH SUPPER   30 tablet   11     Allergies Review of patient's allergies indicates no known allergies.  Family History  Problem Relation Age of Onset  . Stroke Mother   . Cancer Neg Hx     Social History Social History  Substance Use Topics  . Smoking status: Former Smoker    Quit date: 03/07/2011  . Smokeless tobacco: Never Used  . Alcohol Use: No    Review of Systems Constitutional: No fever/shaking may be chills Eyes: No visual changes. ENT: No sore throat. Cardiovascular: Denies chest  pain. Respiratory: Denies shortness of breath. Patient does have a cough Gastrointestinal: No abdominal pain.  No nausea, no vomiting.  No diarrhea.  No constipation. Review of systems limited by confusion  10-point ROS otherwise negative.  ____________________________________________   PHYSICAL EXAM:  VITAL SIGNS: ED Triage Vitals  Enc Vitals Group     BP 02/25/15 1538 129/58 mmHg     Pulse Rate 02/25/15 1538 84     Resp 02/25/15 1538 18     Temp 02/25/15 1538 98.1 F (36.7 C)     Temp Source 02/25/15 1538 Oral     SpO2 02/25/15 1538 98 %     Weight 02/25/15 1538 123 lb (55.792 kg)     Height 02/25/15 1538 6' (1.829 m)     Head Cir --      Peak Flow --      Pain Score --      Pain Loc --      Pain Edu? --      Excl. in GC? --     Constitutional: Alert and oriented to person and hospital. . Eyes: Conjunctivae are normal. Right eye pupil is very large gaze is disconjugate daughter reports this is old EOMI. Head: Atraumatic. Nose: No congestion/rhinnorhea. Mouth/Throat: Mucous membranes are moist.  Oropharynx non-erythematous. Neck: No stridor.   Cardiovascular: Normal rate, regular rhythm. Grossly normal heart sounds.  Good peripheral circulation. Respiratory: Normal respiratory effort.  No retractions. Lungs CTAB. Gastrointestinal: Soft and nontender. No distention. No abdominal bruits. No CVA tenderness. Musculoskeletal: No lower extremity tenderness nor edema.  No joint effusions. Neurologic: Patient moves all extremities equally and well. Patient speaks normally. Although this does not always answer questions appropriately. Skin:  Skin is warm, dry and intact. No rash noted.   ____________________________________________   LABS (all labs ordered are listed, but only abnormal results are displayed)  Labs Reviewed  COMPREHENSIVE METABOLIC PANEL - Abnormal; Notable for the following:    Calcium 8.4 (*)    Albumin 3.1 (*)    ALT 9 (*)    All other components  within normal limits  CBC - Abnormal; Notable for the following:    RBC 3.57 (*)    Hemoglobin 10.5 (*)    HCT 31.8 (*)    RDW 17.1 (*)    All other components within normal limits  GLUCOSE, CAPILLARY - Abnormal; Notable for the following:    Glucose-Capillary 108 (*)    All other components within normal limits  URINALYSIS COMPLETEWITH MICROSCOPIC ([facility] ONLY) - Abnormal; Notable for the following:    Color, Urine YELLOW (*)    APPearance CLEAR (*)    Hgb urine dipstick 1+ (*)    Squamous Epithelial / LPF 0-5 (*)    All other components within normal limits  TROPONIN I  CBG MONITORING, ED   ____________________________________________  EKG   ____________________________________________  RADIOLOGY  Radiology reads the film my review and afterwards it might be some slight worsening of the infiltrate ____________________________________________   PROCEDURES   ____________________________________________   INITIAL IMPRESSION / ASSESSMENT AND PLAN / ED COURSE  Pertinent labs & imaging results that were available during my care of the patient were reviewed by me and considered in my medical decision making (see chart for details).   ____________________________________________   FINAL CLINICAL IMPRESSION(S) / ED DIAGNOSES  Final diagnoses:  Dehydration  Hyperkalemia, diminished renal excretion  Hemoptysis      Arnaldo Natal, MD 02/25/15 2050  The above diagnosis is an error. The the correct diagnosis is altered mental status new-onset. And worsening pleural effusion with some hypoxia.  Arnaldo Natal, MD 02/25/15 2108 Additional diagnosis ileus  Arnaldo Natal, MD 02/25/15 2118

## 2015-02-25 NOTE — ED Notes (Signed)
Patient transported to CT 

## 2015-02-25 NOTE — ED Notes (Signed)
Pts grandaughter states that he has been "acting strange" the past few days, and "talking out of his head." States he wears oxygen at home, but she has had to increase it lately because his sats have been dropping in the 70's.

## 2015-02-26 ENCOUNTER — Observation Stay: Payer: Medicare Other

## 2015-02-26 DIAGNOSIS — E784 Other hyperlipidemia: Secondary | ICD-10-CM | POA: Diagnosis not present

## 2015-02-26 DIAGNOSIS — I10 Essential (primary) hypertension: Secondary | ICD-10-CM | POA: Diagnosis not present

## 2015-02-26 DIAGNOSIS — G9340 Encephalopathy, unspecified: Secondary | ICD-10-CM | POA: Diagnosis not present

## 2015-02-26 DIAGNOSIS — K567 Ileus, unspecified: Secondary | ICD-10-CM | POA: Diagnosis not present

## 2015-02-26 LAB — BASIC METABOLIC PANEL
Anion gap: 6 (ref 5–15)
BUN: 10 mg/dL (ref 6–20)
CALCIUM: 8.1 mg/dL — AB (ref 8.9–10.3)
CHLORIDE: 107 mmol/L (ref 101–111)
CO2: 24 mmol/L (ref 22–32)
CREATININE: 0.6 mg/dL — AB (ref 0.61–1.24)
GFR calc non Af Amer: >60 mL/min (ref >60)
Glucose, Bld: 84 mg/dL (ref 65–99)
Potassium: 3.6 mmol/L (ref 3.5–5.1)
SODIUM: 137 mmol/L (ref 135–145)

## 2015-02-26 LAB — BLOOD GAS, ARTERIAL
ACID-BASE DEFICIT: 0.2 mmol/L (ref 0.0–2.0)
ALLENS TEST (PASS/FAIL): POSITIVE — AB
BICARBONATE: 24.8 meq/L (ref 21.0–28.0)
FIO2: 0.21
O2 Saturation: 93.9 %
PATIENT TEMPERATURE: 37
pCO2 arterial: 41 mmHg (ref 32.0–48.0)
pH, Arterial: 7.39 (ref 7.350–7.450)
pO2, Arterial: 71 mmHg — ABNORMAL LOW (ref 83.0–108.0)

## 2015-02-26 LAB — CBC
HCT: 30 % — ABNORMAL LOW (ref 40.0–52.0)
Hemoglobin: 9.7 g/dL — ABNORMAL LOW (ref 13.0–18.0)
MCH: 28.7 pg (ref 26.0–34.0)
MCHC: 32.2 g/dL (ref 32.0–36.0)
MCV: 88.9 fL (ref 80.0–100.0)
PLATELETS: 210 10*3/uL (ref 150–440)
RBC: 3.37 MIL/uL — ABNORMAL LOW (ref 4.40–5.90)
RDW: 16.5 % — AB (ref 11.5–14.5)
WBC: 6.8 10*3/uL (ref 3.8–10.6)

## 2015-02-26 LAB — PHOSPHORUS: PHOSPHORUS: 3.6 mg/dL (ref 2.5–4.6)

## 2015-02-26 LAB — MAGNESIUM: MAGNESIUM: 2 mg/dL (ref 1.7–2.4)

## 2015-02-26 MED ORDER — FLEET ENEMA 7-19 GM/118ML RE ENEM
1.0000 | ENEMA | Freq: Once | RECTAL | Status: AC
  Administered 2015-02-26: 1 via RECTAL

## 2015-02-26 MED ORDER — BISACODYL 10 MG RE SUPP
10.0000 mg | Freq: Every day | RECTAL | Status: DC
  Administered 2015-02-27: 10 mg via RECTAL
  Filled 2015-02-26: qty 1

## 2015-02-26 MED ORDER — SODIUM CHLORIDE 0.9 % IV SOLN
1.5000 g | Freq: Three times a day (TID) | INTRAVENOUS | Status: DC
  Administered 2015-02-26: 1.5 g via INTRAVENOUS
  Filled 2015-02-26 (×4): qty 1.5

## 2015-02-26 MED ORDER — LACTULOSE 10 GM/15ML PO SOLN
30.0000 g | Freq: Two times a day (BID) | ORAL | Status: DC
Start: 2015-02-26 — End: 2015-02-27
  Administered 2015-02-26: 30 g via ORAL
  Filled 2015-02-26: qty 60

## 2015-02-26 MED ORDER — DOCUSATE SODIUM 100 MG PO CAPS
100.0000 mg | ORAL_CAPSULE | Freq: Two times a day (BID) | ORAL | Status: DC
  Administered 2015-02-26 – 2015-02-28 (×4): 100 mg via ORAL
  Filled 2015-02-26 (×5): qty 1

## 2015-02-26 MED ORDER — BOOST / RESOURCE BREEZE PO LIQD
1.0000 | Freq: Three times a day (TID) | ORAL | Status: DC
  Administered 2015-02-27 (×3): 1 via ORAL

## 2015-02-26 MED ORDER — SODIUM CHLORIDE 0.9 % IV SOLN
INTRAVENOUS | Status: DC
  Administered 2015-02-26 – 2015-02-27 (×2): via INTRAVENOUS

## 2015-02-26 MED ORDER — HALOPERIDOL LACTATE 5 MG/ML IJ SOLN: 1.0000 mg | Freq: Four times a day (QID) | INTRAMUSCULAR | Status: DC | PRN

## 2015-02-26 NOTE — Progress Notes (Signed)
Initial Nutrition Assessment  DOCUMENTATION CODES:   Severe malnutrition in context of chronic illness  INTERVENTION:   Coordination of Care: if pt at risk for aspiration, recommend SLP evaulation. Medical Food Supplement Therapy: will recommend Boost Breeze po TID, each supplement provides 250 kcal and 9 grams of protein, currently and with diet progression will recommend Mighty shakes or Magic cup on meal trays if pt does not like Boost Breeze. Per daughter pt does not like Ensure but has had before.   NUTRITION DIAGNOSIS:   Inadequate oral intake related to acute illness, poor appetite as evidenced by per patient/family report.  GOAL:   Patient will meet greater than or equal to 90% of their needs  MONITOR:    (Energy Intake, Anthropometrics, Digestive System, Electrolyte and renal Profile)  REASON FOR ASSESSMENT:   Malnutrition Screening Tool    ASSESSMENT:   Pt admitted with 2 day h/o AMS with constipation, per MD note pt with ileus.   Past Medical History  Diagnosis Date  . Cataract     2004- Cataract removal, 2007 Right eye cataract  . COPD (chronic obstructive pulmonary disease)   . Gastric ulcer     1977- Gastric Ulcers with hemorrhage and surgical repair  . Atrial fibrillation   . Hypertension   . Hyperlipidemia   . Stroke   . Pneumonia   . Fracture of femoral neck, right     Diet Order:  Diet clear liquid Room service appropriate?: Yes; Fluid consistency:: Thin    Current Nutrition: Pt ate 0% of CL this am, lethargic on visit.   Food/Nutrition-Related History: Per daughter pt eating soft foods at home tomatoes and pimento cheese. Pt was not eating large meals, but smaller portions a couple of times a day. Daughter also reports pt tried Ensures and drank about a case but really does not like them.    Medications: Remeron, lactulose, fleet enema, colace  Electrolyte/Renal Profile and Glucose Profile:   Recent Labs Lab 02/25/15 1616 02/26/15 0402   NA 137 137  K 4.2 3.6  CL 103 107  CO2 27 24  BUN 11 10  CREATININE 0.71 0.60*  CALCIUM 8.4* 8.1*  MG 2.0  --   PHOS 3.6  --   GLUCOSE 98 84   Protein Profile:   Recent Labs Lab 02/25/15 1616  ALBUMIN 3.1*    Gastrointestinal Profile: Last BM: 02/26/2015 watery loose BM documented   Skin:   (Stage I Coccyx pressure ulcer)   Nutrition-Focused Physical Exam Findings: Nutrition-Focused physical exam completed. Findings are severe fat depletion, severe muscle depletion, and no edema.    Weight Change: Per daughter pt UBW about a year ago was 160lbs. (23% weight loss in one year) Per [facility] pt with 7% weight loss 3 weeks, 12% since January 2016   Height:   Ht Readings from Last 1 Encounters:  02/25/15 6' (1.829 m)    Weight:   Wt Readings from Last 1 Encounters:  02/25/15 123 lb (55.792 kg)    Wt Readings from Last 10 Encounters:  02/25/15 123 lb (55.792 kg)  02/07/15 132 lb (59.875 kg)  01/26/15 132 lb 4.8 oz (60.011 kg)  11/07/14 136 lb (61.689 kg)  09/27/14 133 lb 1.9 oz (60.383 kg)  09/17/14 137 lb (62.143 kg)  09/05/14 128 lb 6.4 oz (58.242 kg)  08/01/14 140 lb (63.504 kg)  07/11/14 136 lb (61.689 kg)  06/20/13 147 lb (66.679 kg)    Ideal Body Weight:   80.9kg  BMI:  Body mass index is 16.68 kg/(m^2).  Estimated Nutritional Needs:   Kcal:  1970-2327kcals, BEE: 1492kcals, TEE: (IF 1.1-1.3)(AF 1.2) using IBW of 80.9kg  Protein:  65-81g protein (1.0-1.2g/kg) using IBW of 80.9kg  Fluid:  2023-2489mL of fluid (25-23mL/kg) using IBW of 80.9kg  EDUCATION NEEDS:   Education needs no appropriate at this time    HIGH Care Level  Leda Quail, RD, LDN Pager [phone]

## 2015-02-26 NOTE — Progress Notes (Signed)
Pt is noted to be displaying some what agitated behavior. When I tried to give him his inhaler, he swatted at me and refused it. The pt is laying in bed with his eyes closed and mumbling words that cannot be understood by his daughter or me. Pt is wearing his oxygen and has his hands above his head. Pt's daughter states, "this is not his normal behavior." Pt also refused to have his vital signs checked by the NT.

## 2015-02-26 NOTE — Care Management (Signed)
Spoke with family who was present in room. Patient sleeping.  Has )2 at home and was [facility] to [facility] prior to coming back into hospital this time. Family stated that they would like to continue with [facility] if PT needed. Has walker and bedside commode. Lives with Daughter who is caregiver. Continue to follow for discharge needs.

## 2015-02-26 NOTE — Progress Notes (Signed)
PT Cancellation Note  Patient Details Name: Davione Mannis MRN: [id number] DOB: [birth year]   Cancelled Treatment:    Reason Eval/Treat Not Completed:  (See PT note for further details) PT attempted this AM, however pt was sleeping and unable to attend to therapist regardless of efforts to wake pt to participate. Will attempt at later time/date when pt more alert.    Benna Dunks 02/26/2015, 11:04 AM Benna Dunks, SPT. [phone]

## 2015-02-26 NOTE — Progress Notes (Signed)
Patient was given soap suds enema and has had 2 bowel movements ( soft/watery).  Patient is impulsive and gets up from the bed when he has to urinate.  Bed alarm is activated and Daughter is at bedside.  [name] [name]  02/26/2015 6:09 AM

## 2015-02-26 NOTE — Progress Notes (Signed)
Patient ID: [name] [name], male   DOB: 08-06-1920, 81 y.o.   MRN: [id number] [facility] PROGRESS NOTE  PCP: Tillman Abide, MD  HPI/Subjective: Patient was agitated when I tried to wake him up. He would not communicate with me. As per wife he's had some altered mental status. Constipation has not had a bowel movement in a while.  Objective: Filed Vitals:   02/25/15 2340  BP: 149/86  Pulse: 83  Temp: 97.3 F (36.3 C)  Resp: 18    Filed Weights   02/25/15 1538  Weight: 55.792 kg (123 lb)    ROS: Review of Systems  Unable to perform ROS  altered mental status  Exam: Physical Exam  Constitutional: He appears lethargic.  HENT:  Nose: No mucosal edema.  Mouth/Throat: No oropharyngeal exudate or posterior oropharyngeal edema.  Eyes: Conjunctivae, EOM and lids are normal. Pupils are equal, round, and reactive to light.  Neck: No JVD present. Carotid bruit is not present. No edema present. No thyroid mass and no thyromegaly present.  Cardiovascular: S1 normal and S2 normal.  Exam reveals no gallop.   Murmur heard.  Systolic murmur is present with a grade of 2/6  Pulses:      Dorsalis pedis pulses are 1+ on the right side, and 1+ on the left side.  Respiratory: No respiratory distress. He has decreased breath sounds in the left lower field. He has no wheezes. He has no rhonchi. He has no rales.  GI: Soft. Bowel sounds are normal. There is no tenderness.  Musculoskeletal:       Right ankle: He exhibits no swelling.       Left ankle: He exhibits no swelling.  Lymphadenopathy:    He has no cervical adenopathy.  Neurological: He appears lethargic.  Skin: Skin is warm. Bruising noted. No rash noted. Nails show no clubbing.  Psychiatric: He has a normal mood and affect. He is agitated.    Data Reviewed: Basic Metabolic Panel:  Recent Labs Lab 02/25/15 1616 02/26/15 0402  NA 137 137  K 4.2 3.6  CL 103 107  CO2 27 24  GLUCOSE 98 84  BUN 11 10  CREATININE  0.71 0.60*  CALCIUM 8.4* 8.1*  MG 2.0  --   PHOS 3.6  --    Liver Function Tests:  Recent Labs Lab 02/25/15 1616  AST 17  ALT 9*  ALKPHOS 116  BILITOT 0.6  PROT 7.7  ALBUMIN 3.1*   CBC:  Recent Labs Lab 02/25/15 1616 02/26/15 0402  WBC 7.6 6.8  HGB 10.5* 9.7*  HCT 31.8* 30.0*  MCV 89.0 88.9  PLT 239 210   Studies: Dg Chest 2 View  02/25/2015   CLINICAL DATA:  Acting different past couple days, not himself, on oxygen at home, COPD, hypertension, atrial fibrillation, prior stroke  EXAM: CHEST  2 VIEW  COMPARISON:  01/24/2015  FINDINGS: Upper normal heart size.  Atherosclerotic calcification aorta.  Mediastinal contours and pulmonary vascularity normal.  Emphysematous changes consistent with COPD.  Moderate LEFT and tiny RIGHT pleural effusions with LEFT basilar atelectasis.  Remaining lungs clear.  No pneumothorax.  Bones demineralized.  IMPRESSION: COPD changes with moderate LEFT and tiny RIGHT pleural effusions and LEFT basilar atelectasis.   Electronically Signed   By: Ulyses Southward M.D.   On: 02/25/2015 17:41   Dg Abd 1 View  02/25/2015   CLINICAL DATA:  Altered mental status. Weakness. Apparent large loop of bowel seen on chest x-ray.  EXAM: ABDOMEN - 1  VIEW  COMPARISON:  Chest 02/25/2015  FINDINGS: Prominent gas and stool-filled bowel loop, mostly in the ascending and transverse colon. Findings likely to be due to constipation and ileus. No small bowel distention. Surgical clips in the upper abdomen. No radiopaque stones. Degenerative changes in the spine and left hip. Right hip arthroplasty. Vascular calcifications.  IMPRESSION: Prominent gas and stool in the colon likely to be due to constipation and ileus.   Electronically Signed   By: Burman Nieves M.D.   On: 02/25/2015 18:56   Ct Head Wo Contrast  02/25/2015   CLINICAL DATA:  Altered mental status  EXAM: CT HEAD WITHOUT CONTRAST  TECHNIQUE: Contiguous axial images were obtained from the base of the skull through the  vertex without intravenous contrast.  COMPARISON:  06/30/2012  FINDINGS: There is prominence of the sulci and ventricles consistent with brain atrophy. Right frontal lobe encephalomalacia is identified, image number 20/series 2. There is low attenuation within the subcortical and periventricular white matter consistent with chronic microvascular disease. There is no evidence for acute brain infarct, intracranial hemorrhage or mass. The paranasal sinuses and mastoid air cells are clear. The calvarium is intact.  IMPRESSION: 1. No acute intracranial abnormalities. 2. Right frontal lobe encephalomalacia compatible with previous infarct. 3. Chronic microvascular disease and brain atrophy.   Electronically Signed   By: Signa Kell M.D.   On: 02/25/2015 19:29   Ct Chest W Contrast  02/25/2015   CLINICAL DATA:  Cough.  EXAM: CT CHEST WITH CONTRAST  TECHNIQUE: Multidetector CT imaging of the chest was performed during intravenous contrast administration.  CONTRAST:  75mL OMNIPAQUE IOHEXOL 300 MG/ML  SOLN  COMPARISON:  CT scan of June 09, 2012. Radiographs of September 27, 2014.  FINDINGS: Stable old compression fracture of mid thoracic vertebral body is noted. No pneumothorax is noted. Moderate left pleural effusion is noted with atelectasis of the left lower lobe. Mild subsegmental atelectasis is noted posteriorly in the left upper lobe. Mild right pleural effusion is noted with adjacent subsegmental atelectasis. Mild emphysematous changes noted in both lungs. Stable biapical scarring is noted. Atherosclerosis of thoracic aorta is noted without aneurysm or dissection. Visualized portions of pulmonary arteries appear normal. No mediastinal mass or adenopathy is noted. Visualized portion of upper abdomen is unremarkable.  IMPRESSION: Moderate left pleural effusion is noted with atelectasis of the left lower lobe. Mild right pleural effusion is noted with adjacent subsegmental atelectasis.   Electronically Signed   By:  Lupita Raider, M.D.   On: 02/25/2015 19:44    Scheduled Meds: . aspirin EC  81 mg Oral QODAY  . diltiazem  120 mg Oral Daily  . diltiazem  120 mg Oral Daily  . mirtazapine  7.5 mg Oral QHS  . mometasone-formoterol  2 puff Inhalation BID  . rivaroxaban  15 mg Oral Q supper  . sodium chloride  3 mL Intravenous Q12H   Continuous Infusions: . sodium chloride 75 mL/hr at 02/26/15 0020    Assessment/Plan:  1. Acute encephalopathy- I'm hoping this is because he was in the ER last night and did not sleep last night. I could not wake him up this morning. We'll watch him further today. Mental status must be better prior to going home. It looks like he was recently put on Augmentin for possible infection. We will give IV Unasyn for now. Patient does have a history of vascular dementia. Normally he is able to walk around and feed himself. He needs help getting dressed. 2.  Ileus, constipation- continue to give suppositories and enemas. If mental status improves can give things orally. 3. Left pleural effusion seen on CT scan. Continue to monitor at this point 4. Atrial fibrillation rate controlled and anticoagulated with Xarelto. 5. History of COPD- continue inhalers  Code Status:     Code Status Orders        Start     Ordered   02/25/15 2342  Do not attempt resuscitation (DNR)   Continuous    Question Answer Comment  In the event of cardiac or respiratory ARREST Do not call a "code blue"   In the event of cardiac or respiratory ARREST Do not perform Intubation, CPR, defibrillation or ACLS   In the event of cardiac or respiratory ARREST Use medication by any route, position, wound care, and other measures to relive pain and suffering. May use oxygen, suction and manual treatment of airway obstruction as needed for comfort.      02/25/15 2341     Family Communication: Family at bedside  Disposition Plan: Home once mental status improves  Time spent:  Alford Highland  [facility]

## 2015-02-27 ENCOUNTER — Observation Stay: Payer: Medicare Other

## 2015-02-27 DIAGNOSIS — E784 Other hyperlipidemia: Secondary | ICD-10-CM | POA: Diagnosis not present

## 2015-02-27 DIAGNOSIS — G9340 Encephalopathy, unspecified: Secondary | ICD-10-CM | POA: Diagnosis not present

## 2015-02-27 DIAGNOSIS — K567 Ileus, unspecified: Secondary | ICD-10-CM | POA: Diagnosis not present

## 2015-02-27 DIAGNOSIS — L8990 Pressure ulcer of unspecified site, unspecified stage: Secondary | ICD-10-CM | POA: Insufficient documentation

## 2015-02-27 DIAGNOSIS — R4182 Altered mental status, unspecified: Secondary | ICD-10-CM | POA: Diagnosis not present

## 2015-02-27 DIAGNOSIS — I10 Essential (primary) hypertension: Secondary | ICD-10-CM | POA: Diagnosis not present

## 2015-02-27 DIAGNOSIS — E43 Unspecified severe protein-calorie malnutrition: Secondary | ICD-10-CM | POA: Insufficient documentation

## 2015-02-27 LAB — VITAMIN B12: Vitamin B-12: 188 pg/mL (ref 180–914)

## 2015-02-27 MED ORDER — SENNA 8.6 MG PO TABS
1.0000 | ORAL_TABLET | Freq: Every day | ORAL | Status: DC
  Administered 2015-02-27 – 2015-02-28 (×2): 8.6 mg via ORAL
  Filled 2015-02-27 (×2): qty 1

## 2015-02-27 NOTE — Evaluation (Signed)
Physical Therapy Evaluation Patient Details Name: [name] [name] MRN: [id number] DOB: [birth year] Today's Date: 02/27/2015   History of Present Illness  Pt is a 81 yo male who presents with 5 day hx of constipation and altered mental status.   Clinical Impression  Pt presents with hx of COPD, A-fib, HTN, and stroke. Examination reveals that pt performs bed mobility at min assist, transfers at [facility], and ambulation of 20 ft on 2L O2 at [facility]. His primary physical deficits include decreased activity tolerance, generalized weakness, and mild gait impairments. He will benefit from skilled PT in order to address these deficits for a safe return home. Pt has good caregiver support at home in his daughter. She agreed with PTs recommendation to return home with home health PT.  Pt's cognition and alertness much improved this date.     Follow Up Recommendations Home health PT    Equipment Recommendations  None recommended by PT    Recommendations for Other Services       Precautions / Restrictions Precautions Precautions: Fall Restrictions Weight Bearing Restrictions: No      Mobility  Bed Mobility Overal bed mobility: Needs Assistance Bed Mobility: Supine to Sit     Supine to sit: Min assist     General bed mobility comments: Pt requires assist for trunk support getting to EOB  Transfers Overall transfer level: Needs assistance Equipment used: Rolling walker (2 wheeled) Transfers: Sit to/from Stand Sit to Stand: Min guard         General transfer comment: Pt demonstrates good functional strength coming into standing and lowering himself into sitting. Needs cues to not pull on walker to get up  Ambulation/Gait Ambulation/Gait assistance: Min guard Ambulation Distance (Feet): 20 Feet Assistive device: Rolling walker (2 wheeled) Gait Pattern/deviations: Step-through pattern;Decreased step length - right;Decreased step length - left;Decreased stride length;Shuffle Gait velocity:  decreased   General Gait Details: Pt has slight shuffle gait pattern but shows no unsteadiness or LOB during ambulation. Pt performed ambulation both forward and backward with O2 sat following ambulation on 2L O2 of 93%. 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[employer] (Stroke Patients Only)       Balance Overall balance assessment: No apparent balance deficits (not formally assessed)                                           Pertinent Vitals/Pain Pain Assessment: No/denies pain    Home Living Family/patient expects to be discharged to:: Private residence Living Arrangements: Children;Other relatives Available Help at Discharge: Family;Available 24 hours/day Type of Home: House Home Access: Stairs to enter Entrance Stairs-Rails: Can reach both Entrance Stairs-Number of Steps: 1 Home Layout: One level   Additional Comments: has RW and bedside commode    Prior Function Level of Independence: Independent with assistive device(s)         Comments: Patient uses RW for household mobility.      Hand Dominance        Extremity/Trunk Assessment   Upper Extremity Assessment: Generalized weakness           Lower Extremity Assessment: Overall WFL for tasks assessed         Communication   Communication: No difficulties (voice can be hard to understand)  Cognition  Arousal/Alertness: Awake/alert Behavior During Therapy: WFL for tasks assessed/performed Overall Cognitive Status: Impaired/Different from baseline (Much improved since yesterday.)                      General Comments General comments (skin integrity, edema, etc.): Pt's 5 time sit-to-stand time of 24.3 seconds indicates an increased risk of falling    Exercises Other Exercises Other Exercises: Pt performed bilater therex x10 reps at supervision for technique. Exercises included: SLR, ankle pumps, hip abd/add, chest press  into therapist hands.       Assessment/Plan    PT Assessment Patient needs continued PT services  PT Diagnosis Generalized weakness;Altered mental status;Difficulty walking   PT Problem List Decreased strength;Decreased activity tolerance;Decreased mobility;Decreased cognition;Decreased knowledge of use of DME;Decreased safety awareness;Cardiopulmonary status limiting activity  PT Treatment Interventions DME instruction;Gait training;Stair training;Functional mobility training;Therapeutic activities;Therapeutic exercise;Neuromuscular re-education;Balance training;Cognitive remediation;Patient/family education;Wheelchair mobility training   PT Goals (Current goals can be found in the Care Plan section) Acute Rehab PT Goals Patient Stated Goal: to return home PT Goal Formulation: With patient/family Time For Goal Achievement: 03/13/15 Potential to Achieve Goals: Good    Frequency Min 2X/week   Barriers to discharge        Co-evaluation               End of Session Equipment Utilized During Treatment: Gait belt;Oxygen Activity Tolerance: Patient tolerated treatment well Patient left: in chair;with chair alarm set;with family/visitor present Nurse Communication: Mobility status         Time: 1030-1102 PT Time Calculation (min) (ACUTE ONLY): 32 min   Charges:         PT G CodesBenna Dunks 03/26/15, 11:25 AM  Benna Dunks, SPT. [phone]

## 2015-02-27 NOTE — Progress Notes (Addendum)
Patient ID: [name] [name], male   DOB: 10/31/1920, 81 y.o.   MRN: [id number] [facility] PROGRESS NOTE  PCP: Tillman Abide, MD  HPI/Subjective: Patient able to answer some questions today, no chest pain, no shortness of breath  Objective: Filed Vitals:   02/26/15 2323  BP: 125/70  Pulse: 87  Temp: 97.8 F (36.6 C)  Resp: 14    Filed Weights   02/25/15 1538  Weight: 55.792 kg (123 lb)    ROS: Review of Systems  Respiratory: Negative for cough and shortness of breath.   Cardiovascular: Negative for chest pain.  Gastrointestinal: Negative for nausea, vomiting and abdominal pain.  Musculoskeletal: Negative for joint pain.  Limited review of systems with altered mental status today Exam: Physical Exam  HENT:  Nose: No mucosal edema.  Mouth/Throat: No oropharyngeal exudate or posterior oropharyngeal edema.  Eyes: Conjunctivae, EOM and lids are normal.  Right eye clouded over.  Neck: No JVD present. Carotid bruit is not present. No edema present. No thyroid mass and no thyromegaly present.  Cardiovascular: S1 normal and S2 normal.  Exam reveals no gallop.   Murmur heard.  Systolic murmur is present with a grade of 2/6  Pulses:      Dorsalis pedis pulses are 1+ on the right side, and 1+ on the left side.  Respiratory: No respiratory distress. He has decreased breath sounds in the left lower field. He has no wheezes. He has no rhonchi. He has no rales.  GI: Soft. Bowel sounds are normal. There is no tenderness.  Musculoskeletal:       Right ankle: He exhibits no swelling.       Left ankle: He exhibits no swelling.  Lymphadenopathy:    He has no cervical adenopathy.  Neurological: He is alert.  Able to straight leg raise, and also able to lift arms to comand. Able to answer questions today  Skin: Skin is warm. Bruising noted. No rash noted. Nails show no clubbing.  Psychiatric: He has a normal mood and affect.    Data Reviewed: Basic Metabolic  Panel:  Recent Labs Lab 02/25/15 1616 02/26/15 0402  NA 137 137  K 4.2 3.6  CL 103 107  CO2 27 24  GLUCOSE 98 84  BUN 11 10  CREATININE 0.71 0.60*  CALCIUM 8.4* 8.1*  MG 2.0  --   PHOS 3.6  --    Liver Function Tests:  Recent Labs Lab 02/25/15 1616  AST 17  ALT 9*  ALKPHOS 116  BILITOT 0.6  PROT 7.7  ALBUMIN 3.1*   CBC:  Recent Labs Lab 02/25/15 1616 02/26/15 0402  WBC 7.6 6.8  HGB 10.5* 9.7*  HCT 31.8* 30.0*  MCV 89.0 88.9  PLT 239 210   Studies: Dg Chest 2 View  02/25/2015   CLINICAL DATA:  Acting different past couple days, not himself, on oxygen at home, COPD, hypertension, atrial fibrillation, prior stroke  EXAM: CHEST  2 VIEW  COMPARISON:  01/24/2015  FINDINGS: Upper normal heart size.  Atherosclerotic calcification aorta.  Mediastinal contours and pulmonary vascularity normal.  Emphysematous changes consistent with COPD.  Moderate LEFT and tiny RIGHT pleural effusions with LEFT basilar atelectasis.  Remaining lungs clear.  No pneumothorax.  Bones demineralized.  IMPRESSION: COPD changes with moderate LEFT and tiny RIGHT pleural effusions and LEFT basilar atelectasis.   Electronically Signed   By: Ulyses Southward M.D.   On: 02/25/2015 17:41   Dg Abd 1 View  02/25/2015   CLINICAL DATA:  Altered mental status. Weakness. Apparent large loop of bowel seen on chest x-ray.  EXAM: ABDOMEN - 1 VIEW  COMPARISON:  Chest 02/25/2015  FINDINGS: Prominent gas and stool-filled bowel loop, mostly in the ascending and transverse colon. Findings likely to be due to constipation and ileus. No small bowel distention. Surgical clips in the upper abdomen. No radiopaque stones. Degenerative changes in the spine and left hip. Right hip arthroplasty. Vascular calcifications.  IMPRESSION: Prominent gas and stool in the colon likely to be due to constipation and ileus.   Electronically Signed   By: Burman Nieves M.D.   On: 02/25/2015 18:56   Ct Head Wo Contrast  02/25/2015   CLINICAL DATA:   Altered mental status  EXAM: CT HEAD WITHOUT CONTRAST  TECHNIQUE: Contiguous axial images were obtained from the base of the skull through the vertex without intravenous contrast.  COMPARISON:  06/30/2012  FINDINGS: There is prominence of the sulci and ventricles consistent with brain atrophy. Right frontal lobe encephalomalacia is identified, image number 20/series 2. There is low attenuation within the subcortical and periventricular white matter consistent with chronic microvascular disease. There is no evidence for acute brain infarct, intracranial hemorrhage or mass. The paranasal sinuses and mastoid air cells are clear. The calvarium is intact.  IMPRESSION: 1. No acute intracranial abnormalities. 2. Right frontal lobe encephalomalacia compatible with previous infarct. 3. Chronic microvascular disease and brain atrophy.   Electronically Signed   By: Signa Kell M.D.   On: 02/25/2015 19:29   Ct Chest W Contrast  02/25/2015   CLINICAL DATA:  Cough.  EXAM: CT CHEST WITH CONTRAST  TECHNIQUE: Multidetector CT imaging of the chest was performed during intravenous contrast administration.  CONTRAST:  75mL OMNIPAQUE IOHEXOL 300 MG/ML  SOLN  COMPARISON:  CT scan of June 09, 2012. Radiographs of September 27, 2014.  FINDINGS: Stable old compression fracture of mid thoracic vertebral body is noted. No pneumothorax is noted. Moderate left pleural effusion is noted with atelectasis of the left lower lobe. Mild subsegmental atelectasis is noted posteriorly in the left upper lobe. Mild right pleural effusion is noted with adjacent subsegmental atelectasis. Mild emphysematous changes noted in both lungs. Stable biapical scarring is noted. Atherosclerosis of thoracic aorta is noted without aneurysm or dissection. Visualized portions of pulmonary arteries appear normal. No mediastinal mass or adenopathy is noted. Visualized portion of upper abdomen is unremarkable.  IMPRESSION: Moderate left pleural effusion is noted with  atelectasis of the left lower lobe. Mild right pleural effusion is noted with adjacent subsegmental atelectasis.   Electronically Signed   By: Lupita Raider, M.D.   On: 02/25/2015 19:44    Scheduled Meds: . aspirin EC  81 mg Oral QODAY  . bisacodyl  10 mg Rectal Q1500  . diltiazem  120 mg Oral Daily  . docusate sodium  100 mg Oral BID  . feeding supplement  1 Container Oral TID WC  . mirtazapine  7.5 mg Oral QHS  . mometasone-formoterol  2 puff Inhalation BID  . rivaroxaban  15 mg Oral Q supper  . senna  1 tablet Oral Daily  . sodium chloride  3 mL Intravenous Q12H   Continuous Infusions: . sodium chloride 30 mL/hr at 02/26/15 1714    Assessment/Plan:  1. Acute encephalopathy- likely this is acute delirium. The patient seems improved today as compared to yesterday. Yesterday he was very agitated and did not wake up. We are unable to get an MRI of the brain. Vital signs remained stable.  I will continue gentle IV fluid hydration. I do not want to sedate him, so I'll cancel the MRI of the brain ordered. I will repeat a CT scan of the head. Since he is answering questions at this point I'm hoping he is through his delirium. I will advance diet to pured diet. I will check a TSH and B12 level. 2. Ileus, constipation- continue to give suppositories. Colace and senna orally. 3. Left pleural effusion seen on CT scan. Continue to monitor at this point 4. Atrial fibrillation rate controlled and anticoagulated with Xarelto. 5. History of COPD- continue inhalers 6. Weakness- we'll get physical therapy evaluation  Code Status:     Code Status Orders        Start     Ordered   02/25/15 2342  Do not attempt resuscitation (DNR)   Continuous    Question Answer Comment  In the event of cardiac or respiratory ARREST Do not call a "code blue"   In the event of cardiac or respiratory ARREST Do not perform Intubation, CPR, defibrillation or ACLS   In the event of cardiac or respiratory ARREST Use  medication by any route, position, wound care, and other measures to relive pain and suffering. May use oxygen, suction and manual treatment of airway obstruction as needed for comfort.      02/25/15 2341     Family Communication: Family at bedside  Disposition Plan: Home once mental status improves  Time spent:  Alford Highland  [facility]

## 2015-02-28 DIAGNOSIS — K567 Ileus, unspecified: Secondary | ICD-10-CM | POA: Diagnosis not present

## 2015-02-28 DIAGNOSIS — I10 Essential (primary) hypertension: Secondary | ICD-10-CM | POA: Diagnosis not present

## 2015-02-28 DIAGNOSIS — E784 Other hyperlipidemia: Secondary | ICD-10-CM | POA: Diagnosis not present

## 2015-02-28 DIAGNOSIS — G9340 Encephalopathy, unspecified: Secondary | ICD-10-CM | POA: Diagnosis not present

## 2015-02-28 LAB — BASIC METABOLIC PANEL
Anion gap: 5 (ref 5–15)
BUN: 14 mg/dL (ref 6–20)
CHLORIDE: 110 mmol/L (ref 101–111)
CO2: 25 mmol/L (ref 22–32)
CREATININE: 0.6 mg/dL — AB (ref 0.61–1.24)
Calcium: 7.9 mg/dL — ABNORMAL LOW (ref 8.9–10.3)
Glucose, Bld: 88 mg/dL (ref 65–99)
Potassium: 3.4 mmol/L — ABNORMAL LOW (ref 3.5–5.1)
SODIUM: 140 mmol/L (ref 135–145)

## 2015-02-28 LAB — THYROID PANEL WITH TSH
FREE THYROXINE INDEX: 1.7 (ref 1.2–4.9)
T3 UPTAKE RATIO: 38 % (ref 24–39)
T4 TOTAL: 4.4 ug/dL — AB (ref 4.5–12.0)
TSH: 1.37 u[IU]/mL (ref 0.450–4.500)

## 2015-02-28 MED ORDER — DOCUSATE SODIUM 100 MG PO CAPS: 100.0000 mg | ORAL_CAPSULE | Freq: Two times a day (BID) | ORAL | Status: AC

## 2015-02-28 MED ORDER — SENNA 8.6 MG PO TABS: 1.0000 | ORAL_TABLET | Freq: Every day | ORAL | Status: AC

## 2015-02-28 MED ORDER — FUROSEMIDE 20 MG PO TABS: 20.0000 mg | ORAL_TABLET | Freq: Every day | ORAL | Status: DC

## 2015-02-28 MED ORDER — BOOST / RESOURCE BREEZE PO LIQD: 1.0000 | Freq: Three times a day (TID) | ORAL | Status: AC

## 2015-02-28 NOTE — Discharge Summary (Signed)
[facility] - [place] at [facility]   PATIENT NAME: [name] [name]    MR#:  [id number]  DATE OF BIRTH:  08-Apr-1921  DATE OF ADMISSION:  02/25/2015 ADMITTING PHYSICIAN: Oralia Manis, MD  DATE OF DISCHARGE: 02/28/2015  PRIMARY CARE PHYSICIAN: Tillman Abide, MD    ADMISSION DIAGNOSIS:  Dehydration [E86.0] Hyperkalemia, diminished renal excretion [E87.5] Hemoptysis [R04.2]  DISCHARGE DIAGNOSIS:  Principal Problem:   Ileus Active Problems:   COPD (chronic obstructive pulmonary disease)   Hypertension   Hyperlipidemia   A-fib   Vascular dementia without behavioral disturbance   Protein-calorie malnutrition, severe   Pressure ulcer   SECONDARY DIAGNOSIS:   Past Medical History  Diagnosis Date  . Cataract     2004- Cataract removal, 2007 Right eye cataract  . COPD (chronic obstructive pulmonary disease)   . Gastric ulcer     1977- Gastric Ulcers with hemorrhage and surgical repair  . Atrial fibrillation   . Hypertension   . Hyperlipidemia   . Stroke   . Pneumonia   . Fracture of femoral neck, right     HOSPITAL COURSE:   1. Acute encephalopathy- unclear etiology. Could be secondary to the patient was in the ER all night and did not sleep. The first day of admission we could not arouse him. The second day of admission he did answer some questions but was a little sleepy. On the third day he was answering all questions appropriately and mental status improved. I do not believe this was an infection related patient's vital signs were stable. Patient has underlying dementia. 2 CAT scans of the head were negative for acute event.  2. Ileus and constipation- Colace and senna prescribed to avoid constipation the patient did have Dulcolax suppositories and enemas while here also. 3. Pleural effusions right greater than left- since the patient is on Xarelto I was unable to do anything with those pleural effusions. Can restart Lasix and see if that  improves things. The patient would have to be off Xarelto for 3 days for thoracentesis if it does not improve. 4. Atrial fibrillation on Xarelto for anticoagulation and rate controlled with Cardizem.I will leave the continuation of anticoagulation up to Dr. Alphonsus Sias.  5. Essential hypertension continue home meds 6. Hyperlipidemia unspecified continue usual meds  DISCHARGE CONDITIONS:   Satisfactory  CONSULTS OBTAINED:  None  DRUG ALLERGIES:  No Known Allergies  DISCHARGE MEDICATIONS:   Current Discharge Medication List    START taking these medications   Details  docusate sodium (COLACE) 100 MG capsule Take 1 capsule (100 mg total) by mouth 2 (two) times daily. Qty: 10 capsule, Refills: 0    feeding supplement (BOOST / RESOURCE BREEZE) LIQD Take 1 Container by mouth 3 (three) times daily with meals. Qty: 90 Container, Refills: 0    senna (SENOKOT) 8.6 MG TABS tablet Take 1 tablet (8.6 mg total) by mouth daily. Qty: 120 each, Refills: 0      CONTINUE these medications which have NOT CHANGED   Details  aspirin EC 81 MG tablet Take 81 mg by mouth every other day.    Cholecalciferol (VITAMIN D3) 2000 UNITS capsule Take 2,000 Units by mouth daily.    diltiazem (CARDIZEM) 120 MG tablet Take 120 mg by mouth daily.    Fluticasone-Salmeterol (ADVAIR) 250-50 MCG/DOSE AEPB Inhale 1 puff into the lungs 2 (two) times daily.    furosemide (LASIX) 20 MG tablet Take 20 mg by mouth daily.    mirtazapine (REMERON) 15 MG tablet  Take 0.5 tablets (7.5 mg total) by mouth at bedtime. Qty: 30 tablet, Refills: 11    rivaroxaban (XARELTO) 20 MG TABS tablet Take 20 mg by mouth every evening.      STOP taking these medications     amoxicillin-clavulanate (AUGMENTIN) 875-125 MG per tablet      traMADol (ULTRAM) 50 MG tablet          DISCHARGE INSTRUCTIONS:   Follow-up Dr. Alphonsus Sias 1 week.  If you experience worsening of your admission symptoms, develop shortness of breath, life  threatening emergency, suicidal or homicidal thoughts you must seek medical attention immediately by calling 911 or calling your MD immediately  if symptoms less severe.  You Must read complete instructions/literature along with all the possible adverse reactions/side effects for all the Medicines you take and that have been prescribed to you. Take any new Medicines after you have completely understood and accept all the possible adverse reactions/side effects.   Please note  You were cared for by a hospitalist during your hospital stay. If you have any questions about your discharge medications or the care you received while you were in the hospital after you are discharged, you can call the unit and asked to speak with the hospitalist on call if the hospitalist that took care of you is not available. Once you are discharged, your primary care physician will handle any further medical issues. Please note that NO REFILLS for any discharge medications will be authorized once you are discharged, as it is imperative that you return to your primary care physician (or establish a relationship with a primary care physician if you do not have one) for your aftercare needs so that they can reassess your need for medications and monitor your lab values.    Today   CHIEF COMPLAINT:   Chief Complaint  Patient presents with  . Altered Mental Status    HISTORY OF PRESENT ILLNESS:  [name]  is a 81 y.o. male presented with altered mental status and constipation.    VITAL SIGNS:  Blood pressure 111/57, pulse 83, temperature 98.2 F (36.8 C), temperature source Oral, resp. rate 18, height 6' (1.829 m), weight 55.792 kg (123 lb), SpO2 98 %.    PHYSICAL EXAMINATION:  GENERAL:  81 y.o.-year-old patient lying in the bed with no acute distress.  EYES: Pupils equal, round, reactive to light and accommodation. No scleral icterus. Extraocular muscles intact.  HEENT: Head atraumatic, normocephalic.  Oropharynx and nasopharynx clear.  NECK:  Supple, no jugular venous distention. No thyroid enlargement, no tenderness.  LUNGS:  decreased breath sounds bilaterally bases , no wheezing, rales,rhonchi or crepitation. No use of accessory muscles of respiration.  CARDIOVASCULAR: S1, S2 regular regular. 2/6 systolic murmurs,  no rubs, or gallops.  ABDOMEN: Soft, non-tender, non-distended. Bowel sounds present. No organomegaly or mass.  EXTREMITIES: No pedal edema, cyanosis, or clubbing.  NEUROLOGIC: Cranial nerves II through XII are intact. Muscle strength 5/5 in all extremities. Sensation intact. Gait not checked.  PSYCHIATRIC: The patient is alert.  SKIN: Bruising on arms  DATA REVIEW:   CBC  Recent Labs Lab 02/26/15 0402  WBC 6.8  HGB 9.7*  HCT 30.0*  PLT 210    Chemistries   Recent Labs Lab 02/25/15 1616  02/28/15 0508  NA 137  < > 140  K 4.2  < > 3.4*  CL 103  < > 110  CO2 27  < > 25  GLUCOSE 98  < > 88  BUN 11  < >  14  CREATININE 0.71  < > 0.60*  CALCIUM 8.4*  < > 7.9*  MG 2.0  --   --   AST 17  --   --   ALT 9*  --   --   ALKPHOS 116  --   --   BILITOT 0.6  --   --   < > = values in this interval not displayed.  Cardiac Enzymes No results for input(s): TROPONINI in the last 168 hours.  Microbiology Results  Results for orders placed or performed during the hospital encounter of 01/24/15  Culture, blood (routine x 2)     Status: None   Collection Time: 01/24/15  8:28 PM  Result Value Ref Range Status   Specimen Description BLOOD LEFT WRIST  Final   Special Requests BOTTLES DRAWN AEROBIC AND ANAEROBIC  Final   Culture NO GROWTH 5 DAYS  Final   Report Status 01/29/2015 FINAL  Final  Culture, blood (routine x 2)     Status: None   Collection Time: 01/24/15  8:59 PM  Result Value Ref Range Status   Specimen Description BLOOD RIGHT ARM  Final   Special Requests BOTTLES DRAWN AEROBIC AND ANAEROBIC  Final   Culture NO GROWTH 5 DAYS  Final   Report  Status 01/29/2015 FINAL  Final    RADIOLOGY:  Ct Head Wo Contrast  02/27/2015   CLINICAL DATA:  Altered mental status.  EXAM: CT HEAD WITHOUT CONTRAST  TECHNIQUE: Contiguous axial images were obtained from the base of the skull through the vertex without intravenous contrast.  COMPARISON:  CT head 02/25/2015  FINDINGS: Mild to moderate atrophy, unchanged. Chronic microvascular ischemic change in the white matter. Chronic infarct right frontal lobe unchanged  Negative for acute infarct.  Negative for hemorrhage or mass lesion.  Calvarium is intact.  Carotid artery calcification.  IMPRESSION: Atrophy and chronic ischemic change. No acute abnormality and no change from the recent study.   Electronically Signed   By: Marlan Palau M.D.   On: 02/27/2015 09:35   Management plans discussed with the patient, family and they are in agreement.  CODE STATUS:     Code Status Orders        Start     Ordered   02/25/15 2342  Do not attempt resuscitation (DNR)   Continuous    Question Answer Comment  In the event of cardiac or respiratory ARREST Do not call a "code blue"   In the event of cardiac or respiratory ARREST Do not perform Intubation, CPR, defibrillation or ACLS   In the event of cardiac or respiratory ARREST Use medication by any route, position, wound care, and other measures to relive pain and suffering. May use oxygen, suction and manual treatment of airway obstruction as needed for comfort.      02/25/15 2341      TOTAL TIME TAKING CARE OF THIS PATIENT: 35 minutes.    Alford Highland M.D on 02/28/2015 at 9:33 AM  Between 7am to 6pm - Pager - [phone]  After 6pm go to [email or web address] - password [email or web address] [facility]  [facility]  Office  [phone]  CC: Primary care physician; Tillman Abide, MD

## 2015-02-28 NOTE — Progress Notes (Signed)
Patient alert and oriented, calm and cooperative. Discharge information reviewed and given to patient family. Concerns addressed. IV site removed. Pt wheeled down via auxillary.

## 2015-02-28 NOTE — Discharge Instructions (Signed)
Altered Mental Status °Altered mental status most often refers to an abnormal change in your responsiveness and awareness. It can affect your speech, thought, mobility, memory, attention span, or alertness. It can range from slight confusion to complete unresponsiveness (coma). Altered mental status can be a sign of a serious underlying medical condition. Rapid evaluation and medical treatment is necessary for patients having an altered mental status. °CAUSES  °· Low blood sugar (hypoglycemia) or diabetes. °· Severe loss of body fluids (dehydration) or a body salt (electrolyte) imbalance. °· A stroke or other neurologic problem, such as dementia or delirium. °· A head injury or tumor. °· A drug or alcohol overdose. °· Exposure to toxins or poisons. °· Depression, anxiety, and stress. °· A low oxygen level (hypoxia). °· An infection. °· Blood loss. °· Twitching or shaking (seizure). °· Heart problems, such as heart attack or heart rhythm problems (arrhythmias). °· A body temperature that is too low or too high (hypothermia or hyperthermia). °DIAGNOSIS  °A diagnosis is based on your history, symptoms, physical and neurologic examinations, and diagnostic tests. Diagnostic tests may include: °· Measurement of your blood pressure, pulse, breathing, and oxygen levels (vital signs). °· Blood tests. °· Urine tests. °· X-ray exams. °· A computerized magnetic scan (magnetic resonance imaging, MRI). °· A computerized X-ray scan (computed tomography, CT scan). °TREATMENT  °Treatment will depend on the cause. Treatment may include: °· Management of an underlying medical or mental health condition. °· Critical care or support in the hospital. °HOME CARE INSTRUCTIONS  °· Only take over-the-counter or prescription medicines for pain, discomfort, or fever as directed by your caregiver. °· Manage underlying conditions as directed by your caregiver. °· Eat a healthy, well-balanced diet to maintain strength. °· Join a support group or  prevention program to cope with the condition or trauma that caused the altered mental status. Ask your caregiver to help choose a program that works for you. °· Follow up with your caregiver for further examination, therapy, or testing as directed. °SEEK MEDICAL CARE IF:  °· You feel unwell or have chills. °· You or your family notice a change in your behavior or your alertness. °· You have trouble following your caregiver's treatment plan. °· You have questions or concerns. °SEEK IMMEDIATE MEDICAL CARE IF:  °· You have a rapid heartbeat or have chest pain. °· You have difficulty breathing. °· You have a fever. °· You have a headache with a stiff neck. °· You cough up blood. °· You have blood in your urine or stool. °· You have severe agitation or confusion. °MAKE SURE YOU:  °· Understand these instructions. °· Will watch your condition. °· Will get help right away if you are not doing well or get worse. °Document Released: 12/10/2009 Document Revised: 09/14/2011 Document Reviewed: 12/10/2009 °ExitCare® Patient Information ©2015 [employer]. This information is not intended to replace advice given to you by your health care provider. Make sure you discuss any questions you have with your health care provider. ° °Dehydration, Adult °Dehydration is when you lose more fluids from the body than you take in. Vital organs like the kidneys, brain, and heart cannot function without a proper amount of fluids and salt. Any loss of fluids from the body can cause dehydration.  °CAUSES  °· Vomiting. °· Diarrhea. °· Excessive sweating. °· Excessive urine output. °· Fever. °SYMPTOMS  °Mild dehydration °· Thirst. °· Dry lips. °· Slightly dry mouth. °Moderate dehydration °· Very dry mouth. °· Sunken eyes. °· Skin does not   bounce back quickly when lightly pinched and released. °· Dark urine and decreased urine production. °· Decreased tear production. °· Headache. °Severe dehydration °· Very dry mouth. °· Extreme thirst. °· Rapid,  weak pulse (more than 100 beats per minute at rest). °· Cold hands and feet. °· Not able to sweat in spite of heat and temperature. °· Rapid breathing. °· Blue lips. °· Confusion and lethargy. °· Difficulty being awakened. °· Minimal urine production. °· No tears. °DIAGNOSIS  °Your caregiver will diagnose dehydration based on your symptoms and your exam. Blood and urine tests will help confirm the diagnosis. The diagnostic evaluation should also identify the cause of dehydration. °TREATMENT  °Treatment of mild or moderate dehydration can often be done at home by increasing the amount of fluids that you drink. It is best to drink small amounts of fluid more often. Drinking too much at one time can make vomiting worse. Refer to the home care instructions below. °Severe dehydration needs to be treated at the hospital where you will probably be given intravenous (IV) fluids that contain water and electrolytes. °HOME CARE INSTRUCTIONS  °· Ask your caregiver about specific rehydration instructions. °· Drink enough fluids to keep your urine clear or pale yellow. °· Drink small amounts frequently if you have nausea and vomiting. °· Eat as you normally do. °· Avoid: °¨ Foods or drinks high in sugar. °¨ Carbonated drinks. °¨ Juice. °¨ Extremely hot or cold fluids. °¨ Drinks with caffeine. °¨ Fatty, greasy foods. °¨ Alcohol. °¨ Tobacco. °¨ Overeating. °¨ Gelatin desserts. °· Wash your hands well to avoid spreading bacteria and viruses. °· Only take over-the-counter or prescription medicines for pain, discomfort, or fever as directed by your caregiver. °· Ask your caregiver if you should continue all prescribed and over-the-counter medicines. °· Keep all follow-up appointments with your caregiver. °SEEK MEDICAL CARE IF: °· You have abdominal pain and it increases or stays in one area (localizes). °· You have a rash, stiff neck, or severe headache. °· You are irritable, sleepy, or difficult to awaken. °· You are weak, dizzy, or  extremely thirsty. °SEEK IMMEDIATE MEDICAL CARE IF:  °· You are unable to keep fluids down or you get worse despite treatment. °· You have frequent episodes of vomiting or diarrhea. °· You have blood or green matter (bile) in your vomit. °· You have blood in your stool or your stool looks black and tarry. °· You have not urinated in 6 to 8 hours, or you have only urinated a small amount of very dark urine. °· You have a fever. °· You faint. °MAKE SURE YOU:  °· Understand these instructions. °· Will watch your condition. °· Will get help right away if you are not doing well or get worse. °Document Released: 06/22/2005 Document Revised: 09/14/2011 Document Reviewed: 02/09/2011 °ExitCare® Patient Information ©2015 [employer]. This information is not intended to replace advice given to you by your health care provider. Make sure you discuss any questions you have with your health care provider. ° °

## 2015-02-28 NOTE — Progress Notes (Signed)
Patient O2 sat 91% on room air with exercise (ambulation, sit, and stand).  Post exercise pt O2 sat 97% on room air. No shortness of breath noted

## 2015-02-28 NOTE — Care Management Note (Signed)
Case Management Note  Patient Details  Name: [name] [name] MRN: [id number] Date of Birth: Feb 24, 1921  Subjective/Objective:   Admitted from home with ileus and constipation which has resolved. Met with daughter, Butch Penny. Discussed home health services. Home Health Providers list given to daughter. She prefers [facility], patient has used them before. He is on home O2 at 2L through [facility].  Spoke with Colletta [place] with [facility] and arranged Nursing and PT. Has a walker at home.  Family denies issues obtaining medications or medical care. PCP is Dr. Silvio Pate. Daughter states she provides personal care.                 Action/Plan: Home Health Nursing and PT.   Expected Discharge Date:    02/28/2015              Expected Discharge Plan:  [place]  In-House Referral:     Discharge planning Services  CM Consult  Post Acute Care Choice:    Choice offered to:     DME Arranged:    DME Agency:     HH Arranged:  RN, PT [place] Agency:  [facility]  Status of Service:  Completed, signed off  Medicare Important Message Given:    Date Medicare IM Given:    Medicare IM give by:    Date Additional Medicare IM Given:    Additional Medicare Important Message give by:     If discussed at [place] of Stay Meetings, dates discussed:    Additional Comments:  Jolly Mango, RN 02/28/2015, 10:16 AM

## 2015-03-01 ENCOUNTER — Telehealth: Payer: Self-pay | Admitting: *Deleted

## 2015-03-01 DIAGNOSIS — Z7901 Long term (current) use of anticoagulants: Secondary | ICD-10-CM | POA: Diagnosis not present

## 2015-03-01 DIAGNOSIS — I4891 Unspecified atrial fibrillation: Secondary | ICD-10-CM | POA: Diagnosis not present

## 2015-03-01 DIAGNOSIS — K567 Ileus, unspecified: Secondary | ICD-10-CM | POA: Diagnosis not present

## 2015-03-01 DIAGNOSIS — F0150 Vascular dementia without behavioral disturbance: Secondary | ICD-10-CM | POA: Diagnosis not present

## 2015-03-01 DIAGNOSIS — E785 Hyperlipidemia, unspecified: Secondary | ICD-10-CM | POA: Diagnosis not present

## 2015-03-01 DIAGNOSIS — Z9981 Dependence on supplemental oxygen: Secondary | ICD-10-CM | POA: Diagnosis not present

## 2015-03-01 DIAGNOSIS — I10 Essential (primary) hypertension: Secondary | ICD-10-CM | POA: Diagnosis not present

## 2015-03-01 DIAGNOSIS — L89151 Pressure ulcer of sacral region, stage 1: Secondary | ICD-10-CM | POA: Diagnosis not present

## 2015-03-01 DIAGNOSIS — E860 Dehydration: Secondary | ICD-10-CM | POA: Diagnosis not present

## 2015-03-01 DIAGNOSIS — J449 Chronic obstructive pulmonary disease, unspecified: Secondary | ICD-10-CM | POA: Diagnosis not present

## 2015-03-01 NOTE — Telephone Encounter (Signed)
Transition Care Management Follow-up Telephone Call   Date discharged? 02/28/15   How have you been since you were released from the hospital? Patient is doing well and improving, some weakness   Do you understand why you were in the hospital? yes   Do you understand the discharge instructions? yes   Where were you discharged to? Home   Items Reviewed:  Medications reviewed: yes  Allergies reviewed: yes  Dietary changes reviewed: no  Referrals reviewed: no   Functional Questionnaire:   Activities of Daily Living (ADLs):   He states they are independent in the following: ambulation, feeding, continence, grooming and toileting States they require assistance with the following: bathing and hygiene and dressing   Any transportation issues/concerns?: yes   Any patient concerns? no   Confirmed importance and date/time of follow-up visits scheduled yes, 03/06/15 @ 1100  Provider Appointment booked with Tillman Abide, MD  Confirmed with patient if condition begins to worsen call PCP or go to the ER.  Patient was given the office number and encouraged to call back with question or concerns.  : yes

## 2015-03-04 DIAGNOSIS — L89151 Pressure ulcer of sacral region, stage 1: Secondary | ICD-10-CM | POA: Diagnosis not present

## 2015-03-04 DIAGNOSIS — E860 Dehydration: Secondary | ICD-10-CM | POA: Diagnosis not present

## 2015-03-04 DIAGNOSIS — Z9981 Dependence on supplemental oxygen: Secondary | ICD-10-CM | POA: Diagnosis not present

## 2015-03-04 DIAGNOSIS — Z7901 Long term (current) use of anticoagulants: Secondary | ICD-10-CM | POA: Diagnosis not present

## 2015-03-04 DIAGNOSIS — I10 Essential (primary) hypertension: Secondary | ICD-10-CM | POA: Diagnosis not present

## 2015-03-04 DIAGNOSIS — K567 Ileus, unspecified: Secondary | ICD-10-CM | POA: Diagnosis not present

## 2015-03-04 DIAGNOSIS — E785 Hyperlipidemia, unspecified: Secondary | ICD-10-CM | POA: Diagnosis not present

## 2015-03-04 DIAGNOSIS — F0150 Vascular dementia without behavioral disturbance: Secondary | ICD-10-CM | POA: Diagnosis not present

## 2015-03-04 DIAGNOSIS — J449 Chronic obstructive pulmonary disease, unspecified: Secondary | ICD-10-CM | POA: Diagnosis not present

## 2015-03-04 DIAGNOSIS — I4891 Unspecified atrial fibrillation: Secondary | ICD-10-CM | POA: Diagnosis not present

## 2015-03-05 ENCOUNTER — Telehealth: Payer: Self-pay

## 2015-03-05 DIAGNOSIS — K567 Ileus, unspecified: Secondary | ICD-10-CM | POA: Diagnosis not present

## 2015-03-05 DIAGNOSIS — I10 Essential (primary) hypertension: Secondary | ICD-10-CM | POA: Diagnosis not present

## 2015-03-05 DIAGNOSIS — I4891 Unspecified atrial fibrillation: Secondary | ICD-10-CM | POA: Diagnosis not present

## 2015-03-05 DIAGNOSIS — E785 Hyperlipidemia, unspecified: Secondary | ICD-10-CM | POA: Diagnosis not present

## 2015-03-05 DIAGNOSIS — F0150 Vascular dementia without behavioral disturbance: Secondary | ICD-10-CM | POA: Diagnosis not present

## 2015-03-05 DIAGNOSIS — J449 Chronic obstructive pulmonary disease, unspecified: Secondary | ICD-10-CM | POA: Diagnosis not present

## 2015-03-05 DIAGNOSIS — L89151 Pressure ulcer of sacral region, stage 1: Secondary | ICD-10-CM | POA: Diagnosis not present

## 2015-03-05 DIAGNOSIS — E860 Dehydration: Secondary | ICD-10-CM | POA: Diagnosis not present

## 2015-03-05 DIAGNOSIS — Z7901 Long term (current) use of anticoagulants: Secondary | ICD-10-CM | POA: Diagnosis not present

## 2015-03-05 DIAGNOSIS — Z9981 Dependence on supplemental oxygen: Secondary | ICD-10-CM | POA: Diagnosis not present

## 2015-03-05 NOTE — Telephone Encounter (Signed)
[facility] left v/m; pt has small sore area at coccyx area 1 x 1 or 1/2 x 1/2; can zinc be used or does Dr Alphonsus Sias want to look at first; pt has appt scheduled on 03/06/15 at 11 AM. Pt also having problems with BM. Unable to reach [facility] for further details.

## 2015-03-06 ENCOUNTER — Encounter: Payer: Self-pay | Admitting: Internal Medicine

## 2015-03-06 ENCOUNTER — Ambulatory Visit (INDEPENDENT_AMBULATORY_CARE_PROVIDER_SITE_OTHER): Payer: Medicare Other | Admitting: Internal Medicine

## 2015-03-06 VITALS — BP 120/70 | HR 82 | Temp 98.4°F | Wt 126.0 lb

## 2015-03-06 DIAGNOSIS — L8990 Pressure ulcer of unspecified site, unspecified stage: Secondary | ICD-10-CM | POA: Diagnosis not present

## 2015-03-06 DIAGNOSIS — J9611 Chronic respiratory failure with hypoxia: Secondary | ICD-10-CM | POA: Insufficient documentation

## 2015-03-06 DIAGNOSIS — R410 Disorientation, unspecified: Secondary | ICD-10-CM | POA: Insufficient documentation

## 2015-03-06 DIAGNOSIS — F0150 Vascular dementia without behavioral disturbance: Secondary | ICD-10-CM | POA: Diagnosis not present

## 2015-03-06 NOTE — Assessment & Plan Note (Signed)
Some healing now with only stage 1 Will just use zinc oxide for protection

## 2015-03-06 NOTE — Progress Notes (Signed)
Subjective:    Patient ID: [name] [name], male    DOB: 07-06-1921, 81 y.o.   MRN: [id number]  HPI Here for hospital follow up Reviewed hospital records and med rec, etc Here with granddaughter  He started talking "out of his head"--didn't know where he was, etc Was shaky Did start the antibiotic--called in and was referred to the hospital  Delirium lasted for several days--unclear reason Concern for obstipation--not clearly the problem though (didn't seem that bad)  Breathing is okay Some cough--mostly in throat No fever  No chest pain No palpitations---was fast when sick  Has small sacral decubitus Not painful Using ointment on  Current Outpatient Prescriptions on File Prior to Visit  Medication Sig Dispense Refill  . aspirin EC 81 MG tablet Take 81 mg by mouth every other day.    . Cholecalciferol (VITAMIN D3) 2000 UNITS capsule Take 2,000 Units by mouth daily.    [name] diltiazem (CARDIZEM) 120 MG tablet Take 120 mg by mouth daily.    [name] docusate sodium (COLACE) 100 MG capsule Take 1 capsule (100 mg total) by mouth 2 (two) times daily. 10 capsule 0  . feeding supplement (BOOST / RESOURCE BREEZE) LIQD Take 1 Container by mouth 3 (three) times daily with meals. 90 Container 0  . Fluticasone-Salmeterol (ADVAIR) 250-50 MCG/DOSE AEPB Inhale 1 puff into the lungs 2 (two) times daily.    . furosemide (LASIX) 20 MG tablet Take 20 mg by mouth daily.    . mirtazapine (REMERON) 15 MG tablet Take 0.5 tablets (7.5 mg total) by mouth at bedtime. 30 tablet 11  . rivaroxaban (XARELTO) 20 MG TABS tablet Take 20 mg by mouth every evening.    . senna (SENOKOT) 8.6 MG TABS tablet Take 1 tablet (8.6 mg total) by mouth daily. 120 each 0   No current facility-administered medications on file prior to visit.    No Known Allergies  Past Medical History  Diagnosis Date  . Cataract     2004- Cataract removal, 2007 Right eye cataract  . COPD (chronic obstructive pulmonary disease)   . Gastric  ulcer     1977- Gastric Ulcers with hemorrhage and surgical repair  . Atrial fibrillation   . Hypertension   . Hyperlipidemia   . Stroke   . Pneumonia   . Fracture of femoral neck, right     Past Surgical History  Procedure Laterality Date  . Laceration repair      1983- laceration right wrist   . Carotid endarterectomy  214    right===Dr Dew  . Right hip hemiarthroplasty    . Fracture surgery Right 2014    hip    Family History  Problem Relation Age of Onset  . Stroke Mother   . Cancer Neg Hx     Social History   Social History  . Marital Status: Widowed    Spouse Name: N/A  . Number of Children: 3  . Years of Education: N/A   Occupational History  . retired Scientist, product/process development    Social History Main Topics  . Smoking status: Former Smoker    Quit date: 03/07/2011  . Smokeless tobacco: Never Used  . Alcohol Use: No  . Drug Use: No  . Sexual Activity: No   Other Topics Concern  . Not on file   Social History Narrative   Lives with oldest daughter   Jamesetta So should make decisions for him   Has DNR--reviewed and he still wants this  Review of Systems Gets stopped up with bowels at home-- uses miralax prn. Has been regular lately Appetite still not great Weight up slightly from last week    Objective:   Physical Exam  Constitutional: No distress.  Neck: Normal range of motion. Neck supple. No thyromegaly present.  Cardiovascular: Normal rate.  Exam reveals no gallop.   No murmur heard. irregular  Pulmonary/Chest: He has no wheezes. He has no rales.  Decreased breath sounds and dull ~ halfway up on left  Musculoskeletal: He exhibits no edema.  Lymphadenopathy:    He has no cervical adenopathy.  Skin:  ~1.5cm stage 1 sacral decub          Assessment & Plan:

## 2015-03-06 NOTE — Telephone Encounter (Signed)
Thick covering of zinc oxide probably fine but I will look at it today

## 2015-03-06 NOTE — Assessment & Plan Note (Signed)
Unclear cause I suspect an infection though Better now

## 2015-03-06 NOTE — Assessment & Plan Note (Signed)
Now back to baseline Needs 24/7 care

## 2015-03-06 NOTE — Assessment & Plan Note (Signed)
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

## 2015-03-06 NOTE — Progress Notes (Signed)
Pre visit review using our clinic review tool, if applicable. No additional management support is needed unless otherwise documented below in the visit note. 

## 2015-03-07 DIAGNOSIS — I4891 Unspecified atrial fibrillation: Secondary | ICD-10-CM | POA: Diagnosis not present

## 2015-03-07 DIAGNOSIS — L89151 Pressure ulcer of sacral region, stage 1: Secondary | ICD-10-CM | POA: Diagnosis not present

## 2015-03-07 DIAGNOSIS — E860 Dehydration: Secondary | ICD-10-CM | POA: Diagnosis not present

## 2015-03-07 DIAGNOSIS — Z9981 Dependence on supplemental oxygen: Secondary | ICD-10-CM | POA: Diagnosis not present

## 2015-03-07 DIAGNOSIS — I10 Essential (primary) hypertension: Secondary | ICD-10-CM | POA: Diagnosis not present

## 2015-03-07 DIAGNOSIS — Z7901 Long term (current) use of anticoagulants: Secondary | ICD-10-CM | POA: Diagnosis not present

## 2015-03-07 DIAGNOSIS — J449 Chronic obstructive pulmonary disease, unspecified: Secondary | ICD-10-CM | POA: Diagnosis not present

## 2015-03-07 DIAGNOSIS — E785 Hyperlipidemia, unspecified: Secondary | ICD-10-CM | POA: Diagnosis not present

## 2015-03-07 DIAGNOSIS — F0150 Vascular dementia without behavioral disturbance: Secondary | ICD-10-CM | POA: Diagnosis not present

## 2015-03-07 DIAGNOSIS — K567 Ileus, unspecified: Secondary | ICD-10-CM | POA: Diagnosis not present

## 2015-03-08 DIAGNOSIS — L89151 Pressure ulcer of sacral region, stage 1: Secondary | ICD-10-CM | POA: Diagnosis not present

## 2015-03-08 DIAGNOSIS — Z7901 Long term (current) use of anticoagulants: Secondary | ICD-10-CM | POA: Diagnosis not present

## 2015-03-08 DIAGNOSIS — E860 Dehydration: Secondary | ICD-10-CM | POA: Diagnosis not present

## 2015-03-08 DIAGNOSIS — I10 Essential (primary) hypertension: Secondary | ICD-10-CM | POA: Diagnosis not present

## 2015-03-08 DIAGNOSIS — E785 Hyperlipidemia, unspecified: Secondary | ICD-10-CM | POA: Diagnosis not present

## 2015-03-08 DIAGNOSIS — J449 Chronic obstructive pulmonary disease, unspecified: Secondary | ICD-10-CM | POA: Diagnosis not present

## 2015-03-08 DIAGNOSIS — F0150 Vascular dementia without behavioral disturbance: Secondary | ICD-10-CM | POA: Diagnosis not present

## 2015-03-08 DIAGNOSIS — I4891 Unspecified atrial fibrillation: Secondary | ICD-10-CM | POA: Diagnosis not present

## 2015-03-08 DIAGNOSIS — K567 Ileus, unspecified: Secondary | ICD-10-CM | POA: Diagnosis not present

## 2015-03-08 DIAGNOSIS — Z9981 Dependence on supplemental oxygen: Secondary | ICD-10-CM | POA: Diagnosis not present

## 2015-03-11 DIAGNOSIS — L89151 Pressure ulcer of sacral region, stage 1: Secondary | ICD-10-CM | POA: Diagnosis not present

## 2015-03-11 DIAGNOSIS — Z7901 Long term (current) use of anticoagulants: Secondary | ICD-10-CM | POA: Diagnosis not present

## 2015-03-11 DIAGNOSIS — I4891 Unspecified atrial fibrillation: Secondary | ICD-10-CM | POA: Diagnosis not present

## 2015-03-11 DIAGNOSIS — E860 Dehydration: Secondary | ICD-10-CM | POA: Diagnosis not present

## 2015-03-11 DIAGNOSIS — J449 Chronic obstructive pulmonary disease, unspecified: Secondary | ICD-10-CM | POA: Diagnosis not present

## 2015-03-11 DIAGNOSIS — K567 Ileus, unspecified: Secondary | ICD-10-CM | POA: Diagnosis not present

## 2015-03-11 DIAGNOSIS — Z9981 Dependence on supplemental oxygen: Secondary | ICD-10-CM | POA: Diagnosis not present

## 2015-03-11 DIAGNOSIS — F0150 Vascular dementia without behavioral disturbance: Secondary | ICD-10-CM | POA: Diagnosis not present

## 2015-03-11 DIAGNOSIS — I10 Essential (primary) hypertension: Secondary | ICD-10-CM | POA: Diagnosis not present

## 2015-03-11 DIAGNOSIS — E785 Hyperlipidemia, unspecified: Secondary | ICD-10-CM | POA: Diagnosis not present

## 2015-03-12 DIAGNOSIS — J449 Chronic obstructive pulmonary disease, unspecified: Secondary | ICD-10-CM | POA: Diagnosis not present

## 2015-03-12 DIAGNOSIS — K567 Ileus, unspecified: Secondary | ICD-10-CM | POA: Diagnosis not present

## 2015-03-12 DIAGNOSIS — E860 Dehydration: Secondary | ICD-10-CM | POA: Diagnosis not present

## 2015-03-12 DIAGNOSIS — L89151 Pressure ulcer of sacral region, stage 1: Secondary | ICD-10-CM | POA: Diagnosis not present

## 2015-03-14 ENCOUNTER — Telehealth: Payer: Self-pay

## 2015-03-14 NOTE — Telephone Encounter (Signed)
Jennie nurse with [facility] left v/m; pt having thick secretions and hard to cough up; [name] [name] wants to know if could try Mucinex or what would Dr Alphonsus Sias suggest. [name] [name] request cb.

## 2015-03-14 NOTE — Telephone Encounter (Signed)
Spoke with nurse and advised results  

## 2015-03-14 NOTE — Telephone Encounter (Signed)
Okay to try mucinex to see if it helps--  bid prn

## 2015-03-15 DIAGNOSIS — J449 Chronic obstructive pulmonary disease, unspecified: Secondary | ICD-10-CM | POA: Diagnosis not present

## 2015-03-21 DIAGNOSIS — I480 Paroxysmal atrial fibrillation: Secondary | ICD-10-CM | POA: Diagnosis not present

## 2015-03-21 DIAGNOSIS — F0150 Vascular dementia without behavioral disturbance: Secondary | ICD-10-CM | POA: Diagnosis not present

## 2015-03-21 DIAGNOSIS — J441 Chronic obstructive pulmonary disease with (acute) exacerbation: Secondary | ICD-10-CM | POA: Diagnosis not present

## 2015-03-21 DIAGNOSIS — J9621 Acute and chronic respiratory failure with hypoxia: Secondary | ICD-10-CM | POA: Diagnosis not present

## 2015-04-03 ENCOUNTER — Ambulatory Visit (INDEPENDENT_AMBULATORY_CARE_PROVIDER_SITE_OTHER): Payer: Medicare Other | Admitting: Internal Medicine

## 2015-04-03 ENCOUNTER — Encounter: Payer: Self-pay | Admitting: Internal Medicine

## 2015-04-03 VITALS — BP 128/70 | HR 107 | Temp 97.9°F | Wt 129.0 lb

## 2015-04-03 DIAGNOSIS — R918 Other nonspecific abnormal finding of lung field: Secondary | ICD-10-CM | POA: Diagnosis not present

## 2015-04-03 DIAGNOSIS — J439 Emphysema, unspecified: Secondary | ICD-10-CM | POA: Diagnosis not present

## 2015-04-03 DIAGNOSIS — I4819 Other persistent atrial fibrillation: Secondary | ICD-10-CM

## 2015-04-03 DIAGNOSIS — E43 Unspecified severe protein-calorie malnutrition: Secondary | ICD-10-CM

## 2015-04-03 DIAGNOSIS — I481 Persistent atrial fibrillation: Secondary | ICD-10-CM

## 2015-04-03 DIAGNOSIS — J9611 Chronic respiratory failure with hypoxia: Secondary | ICD-10-CM

## 2015-04-03 NOTE — Assessment & Plan Note (Signed)
CXR consistent with undiagnosed carcinoma No evaluation or Rx indicated Will restart antibiotics for ANY early signs of infection though

## 2015-04-03 NOTE — Assessment & Plan Note (Signed)
Has gained 3# Will continue the mirtazapine

## 2015-04-03 NOTE — Assessment & Plan Note (Signed)
Worsened oxygen needs He must have long tubing or light portable oxygen so he is never off the oxygen Hospice is very appropriate

## 2015-04-03 NOTE — Progress Notes (Signed)
Subjective:    Patient ID: [name] [name], male    DOB: 08-25-1920, 81 y.o.   MRN: [id number]  HPI Here with family Follow up of respiratory failure  Oxygen levels are worse They have had to increase oxygen to 5l/min They are very happy with hospice care Have started nebulizer--he feels this has helped his breathing  He walks to bathroom but gets very dyspneic Doesn't have portable oxygen--needs longer oxygen tubing Needs help with all personal care Has hospice aides 3 times a week for bathing  Some cough still Only occasional sputum (slight yellow tinge) No fever  Appetite is fair--very variable Weight up 3# Doesn't seem to have fluid accumulation No chest pain  Will have palpitations if walking without the oxygen  Current Outpatient Prescriptions on File Prior to Visit  Medication Sig Dispense Refill  . aspirin EC 81 MG tablet Take 81 mg by mouth every other day.    . Cholecalciferol (VITAMIN D3) 2000 UNITS capsule Take 2,000 Units by mouth daily.    [name] diltiazem (CARDIZEM) 120 MG tablet Take 120 mg by mouth daily.    [name] docusate sodium (COLACE) 100 MG capsule Take 1 capsule (100 mg total) by mouth 2 (two) times daily. 10 capsule 0  . feeding supplement (BOOST / RESOURCE BREEZE) LIQD Take 1 Container by mouth 3 (three) times daily with meals. 90 Container 0  . Fluticasone-Salmeterol (ADVAIR) 250-50 MCG/DOSE AEPB Inhale 1 puff into the lungs 2 (two) times daily.    . furosemide (LASIX) 20 MG tablet Take 20 mg by mouth daily.    . mirtazapine (REMERON) 15 MG tablet Take 0.5 tablets (7.5 mg total) by mouth at bedtime. 30 tablet 11  . rivaroxaban (XARELTO) 20 MG TABS tablet Take 20 mg by mouth every evening.    . senna (SENOKOT) 8.6 MG TABS tablet Take 1 tablet (8.6 mg total) by mouth daily. 120 each 0   No current facility-administered medications on file prior to visit.    No Known Allergies  Past Medical History  Diagnosis Date  . Cataract     2004- Cataract removal,  2007 Right eye cataract  . COPD (chronic obstructive pulmonary disease)   . Gastric ulcer     1977- Gastric Ulcers with hemorrhage and surgical repair  . Atrial fibrillation   . Hypertension   . Hyperlipidemia   . Stroke   . Pneumonia   . Fracture of femoral neck, right     Past Surgical History  Procedure Laterality Date  . Laceration repair      1983- laceration right wrist   . Carotid endarterectomy  214    right===Dr Dew  . Right hip hemiarthroplasty    . Fracture surgery Right 2014    hip    Family History  Problem Relation Age of Onset  . Stroke Mother   . Cancer Neg Hx     Social History   Social History  . Marital Status: Widowed    Spouse Name: N/A  . Number of Children: 3  . Years of Education: N/A   Occupational History  . retired Scientist, product/process development    Social History Main Topics  . Smoking status: Former Smoker    Quit date: 03/07/2011  . Smokeless tobacco: Never Used  . Alcohol Use: No  . Drug Use: No  . Sexual Activity: No   Other Topics Concern  . Not on file   Social History Narrative   Lives with oldest daughter   Jamesetta So should  make decisions for him   Has DNR--reviewed and he still wants this         Review of Systems Bowels still slow--senna increased Not sleeping well--but naps often No N/V No pain issues    Objective:   Physical Exam  Constitutional: He appears well-developed. No distress.  Neck: Normal range of motion. Neck supple. No thyromegaly present.  Cardiovascular: Normal rate.   Rate is now back in 90's irregular  Pulmonary/Chest: Effort normal. No respiratory distress. He has no rales.  Decreased breath sounds Slight expiratory wheezes but not tight  Abdominal: Soft. There is no tenderness.  Musculoskeletal: He exhibits no edema.  Lymphadenopathy:    He has no cervical adenopathy.  Psychiatric: He has a normal mood and affect. His behavior is normal.          Assessment & Plan:

## 2015-04-03 NOTE — Assessment & Plan Note (Signed)
Rate goes up with hypoxia (any activity) Still on xarelto

## 2015-04-03 NOTE — Assessment & Plan Note (Signed)
Severe Unable to pull in meds from [facility]--doing better with nebs

## 2015-04-03 NOTE — Progress Notes (Signed)
Pre visit review using our clinic review tool, if applicable. No additional management support is needed unless otherwise documented below in the visit note. 

## 2015-04-08 ENCOUNTER — Telehealth: Payer: Self-pay | Admitting: Internal Medicine

## 2015-04-08 MED ORDER — PREDNISONE 20 MG PO TABS: 40.0000 mg | ORAL_TABLET | Freq: Every day | ORAL | Status: AC

## 2015-04-08 NOTE — Telephone Encounter (Signed)
Spoke with nurse and advised results  

## 2015-04-08 NOTE — Telephone Encounter (Signed)
Lisa from hospice called -  She is requesting prednisone for pt. He is having a lot more wheezing and sob  cb [phone]

## 2015-04-08 NOTE — Telephone Encounter (Signed)
Please let her know I sent the prescription 

## 2015-04-11 ENCOUNTER — Telehealth: Payer: Self-pay | Admitting: Internal Medicine

## 2015-04-11 NOTE — Telephone Encounter (Signed)
[name] [name] called to let you know She is seeing pt today he has rhonchi in all left lobes No fever He already has amoxacillin 875 mg in home that dr Alphonsus Sias had given him Do you want him to start this  Please advise

## 2015-04-11 NOTE — Telephone Encounter (Signed)
Spoke with nurse and advised results  

## 2015-04-11 NOTE — Telephone Encounter (Signed)
Okay to start that just in case He abnormal lungs with mass and probable cancer--is very susceptible to pneumonia (it is probably augmentin)  Go ahead and start it

## 2015-06-10 ENCOUNTER — Other Ambulatory Visit: Payer: Self-pay | Admitting: Internal Medicine

## 2015-06-19 DIAGNOSIS — I480 Paroxysmal atrial fibrillation: Secondary | ICD-10-CM | POA: Diagnosis not present

## 2015-06-19 DIAGNOSIS — J9621 Acute and chronic respiratory failure with hypoxia: Secondary | ICD-10-CM | POA: Diagnosis not present

## 2015-06-19 DIAGNOSIS — J441 Chronic obstructive pulmonary disease with (acute) exacerbation: Secondary | ICD-10-CM | POA: Diagnosis not present

## 2015-06-19 DIAGNOSIS — F0150 Vascular dementia without behavioral disturbance: Secondary | ICD-10-CM

## 2015-07-03 ENCOUNTER — Telehealth: Payer: Self-pay | Admitting: Internal Medicine

## 2015-07-03 NOTE — Telephone Encounter (Signed)
Family called, doe not think that pt can make it to the office for his appt in Januray.  Family and hospice agrees with  asking if [facility] can [facility] Please call daughter back at [phone] Thank you

## 2015-07-03 NOTE — Telephone Encounter (Signed)
Spoke to her I will put him on my home visit list I have another visit near him on 1/11 so will probably see him that day  Lyla Sonarrie, Please cancel any office appts

## 2015-07-03 NOTE — Telephone Encounter (Signed)
Appointment cancelled

## 2015-07-09 ENCOUNTER — Telehealth: Payer: Self-pay

## 2015-07-09 ENCOUNTER — Other Ambulatory Visit: Payer: Self-pay | Admitting: Internal Medicine

## 2015-07-09 NOTE — Telephone Encounter (Signed)
Let's do urinalysis. No fevers? We can try low dose seroquel 25mg  nightly as needed. Let us know how this helps.

## 2015-07-09 NOTE — Telephone Encounter (Signed)
Received refill request electronically Last refill 03/14/15 Last office visit 04/03/15 Is it okay to refill?

## 2015-07-09 NOTE — Telephone Encounter (Signed)
Spoke with Misty StanleyLisa. She said she spoke with her med Interior and spatial designerdirector and he ordered haldol to help calm patient. They were unable to get UA due to his combativeness, so they wanted to get him calmed down and then they would attempt cath/UA again. She said the med director would handle this since Dr. Alphonsus SiasLetvak was out of the office this week.

## 2015-07-09 NOTE — Telephone Encounter (Signed)
Misty StanleyLisa nurse with [facility]osp[facility] left v/m; for 2 weeks pt has severe agitation, combativeness and hallucinations(worse in the evening); treating with lorazapam 0.5 mg taking 2 - 3 tabs for the family to get pt calmed down. Misty StanleyLisa wants to know if could try haldol or zyprexa. Pt takes mirtazapine 15 mg at hs. Misty StanleyLisa wants to know if she could get an order since this is sudden onset of symptom of agitation to do urinalysis; no other symptoms of UTI and no fever. Lisa request cb.Dr Alphonsus SiasLetvak out of office this week; sending message to Dr Reece AgarG.

## 2015-07-09 NOTE — Telephone Encounter (Signed)
Noted  

## 2015-07-15 ENCOUNTER — Telehealth: Payer: Self-pay | Admitting: *Deleted

## 2015-07-16 ENCOUNTER — Encounter: Payer: Medicare Other | Admitting: Internal Medicine

## 2015-08-07 NOTE — Telephone Encounter (Signed)
PLEASE NOTE: All timestamps contained within this report are represented as [place] Standard Time. CONFIDENTIALTY NOTICE: This fax transmission is intended only for the addressee. It contains information that is legally privileged, confidential or otherwise protected from use or disclosure. If you are not the intended recipient, you are strictly prohibited from reviewing, disclosing, copying using or disseminating any of this information or taking any action in reliance on or regarding this information. If you have received this fax in error, please notify us immediately by telephone so that we can arrange for its return to us. Phone: [phone], Toll-Free: [phone], Fax: [phone] Page: 1 of 1 Call Id: [id number] [facility] Client TELEPHONE ADVICE RECORD [facility] Patient Name: [name] [name] Gender: Male DOB: 09/06/1920 Age: [age] Y 7 M 1 D Return Phone Number: Address: City/State/Zip: [place] [facility] Physician [place], Richard Contact Type Call Caller Name [place] @ [name] [place] Caller Phone Number [phone] Relationship To Patient Provider Is this call to report lab results? No Call Type General Information Initial Comment [name] [place] [phone] Caller states the pt passed away. General Information Type Message Only Nurse Assessment Guidelines Guideline Title Affirmed Question Affirmed Notes Nurse Date/Time (Eastern Time) Disp. Time [name](Eastern Time) Disposition Final User 07/14/2015 11:21:03 PM General Information Provided Yes [name], [name] After Care Instructions Given Call Event Type User Date / Time Description

## 2015-08-07 NOTE — Telephone Encounter (Signed)
Expressed my condolences to his daughter

## 2015-08-07 DEATH — deceased

## 2016-07-24 IMAGING — CR DG CHEST 2V
3 series · 3 of 3 positions shown · non-contrast
Comparison: 08/23/2012.

CLINICAL DATA: Subsequent encounter for left lower lobe pneumonia.

EXAM:
CHEST  2 VIEW

[view not recorded (1 of 3)]
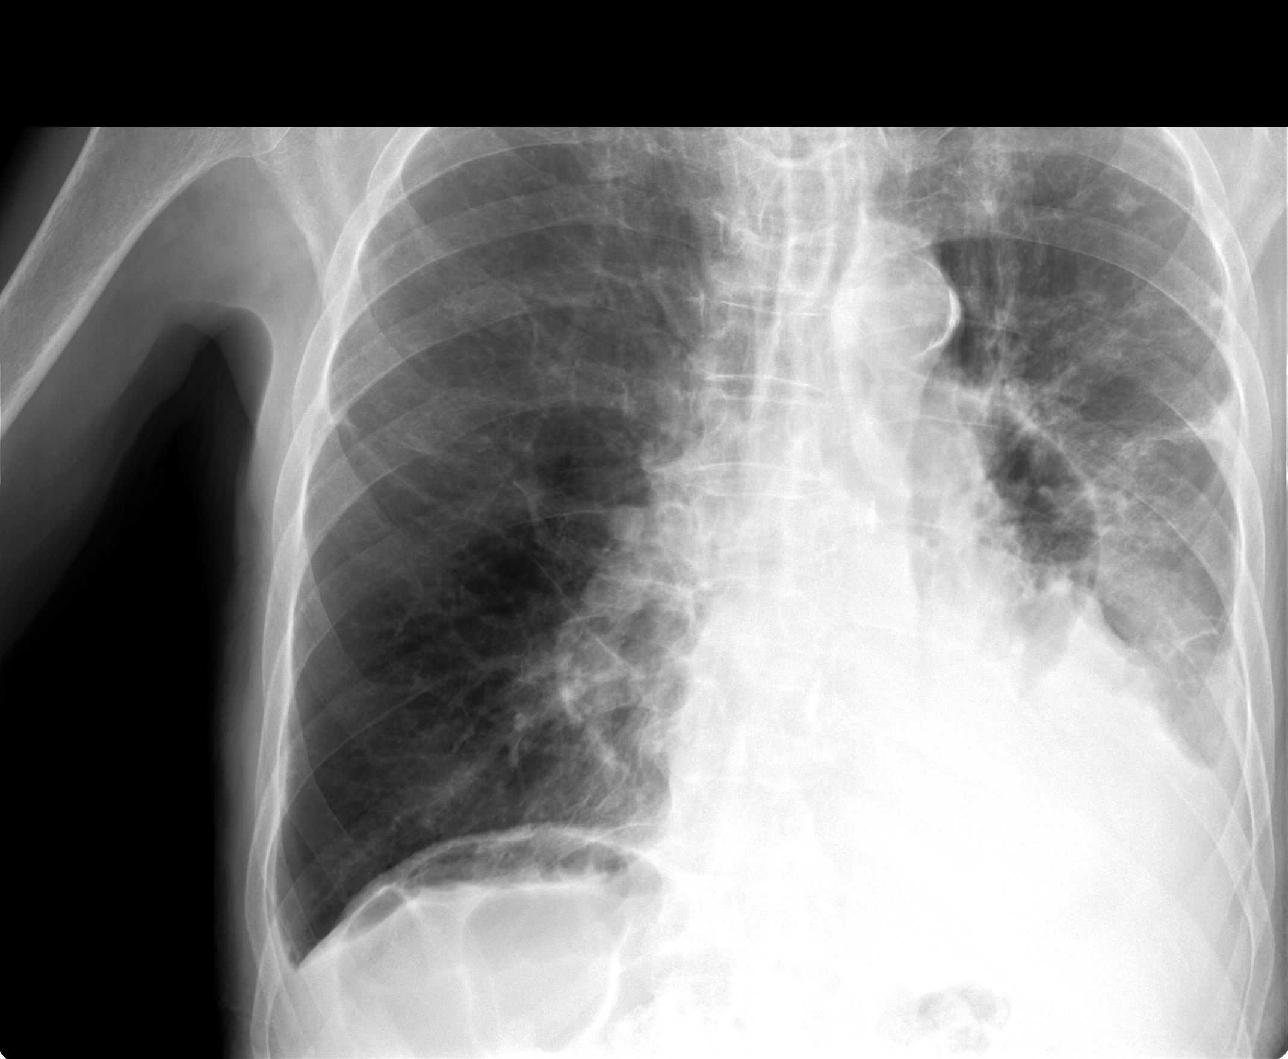

[view not recorded (2 of 3)]
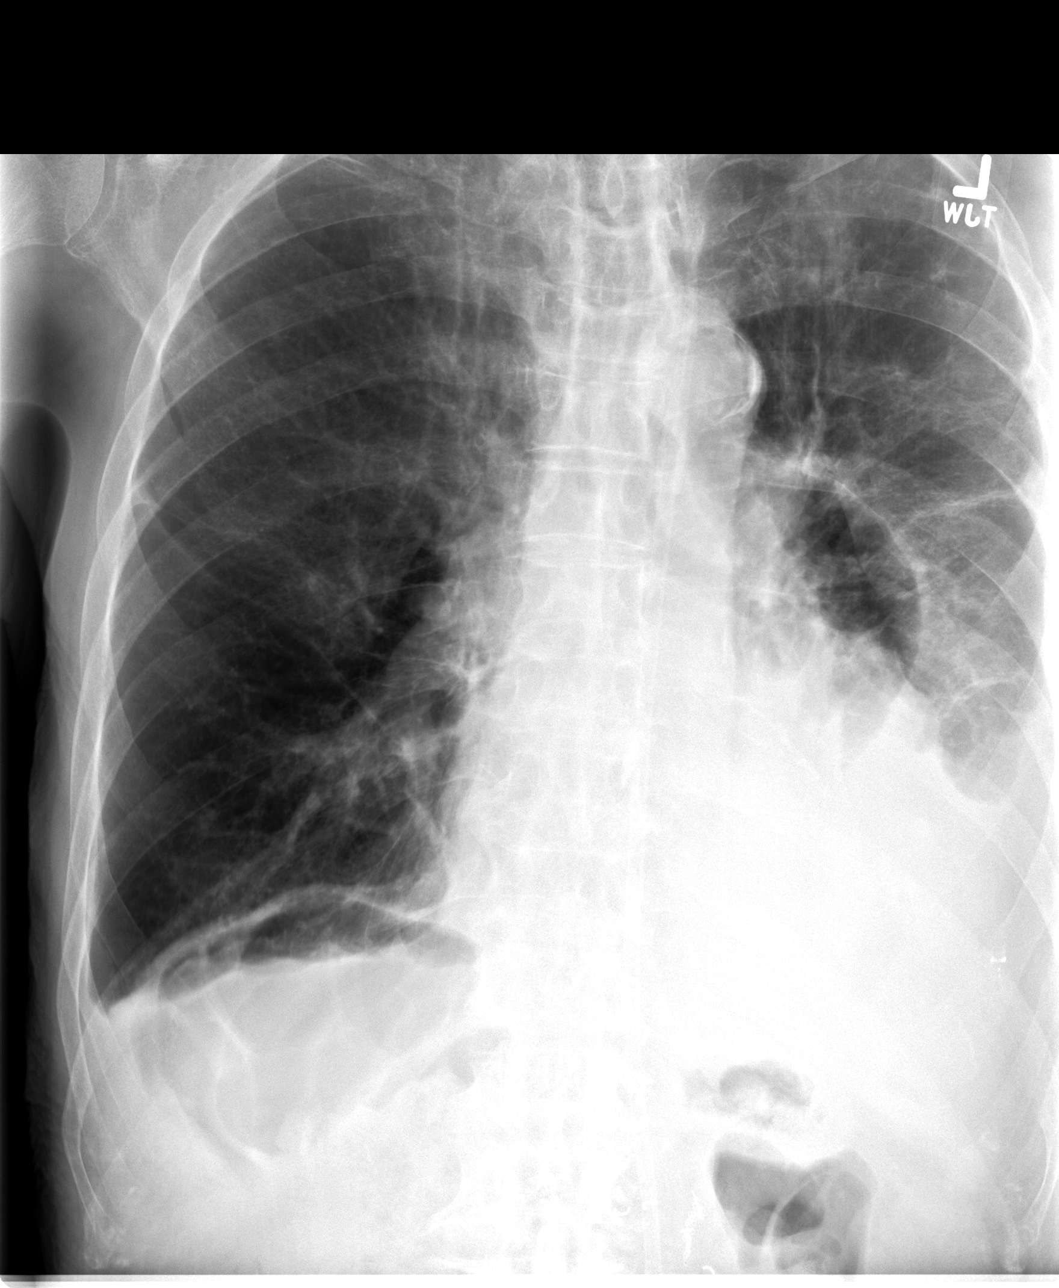

[view not recorded (3 of 3)]
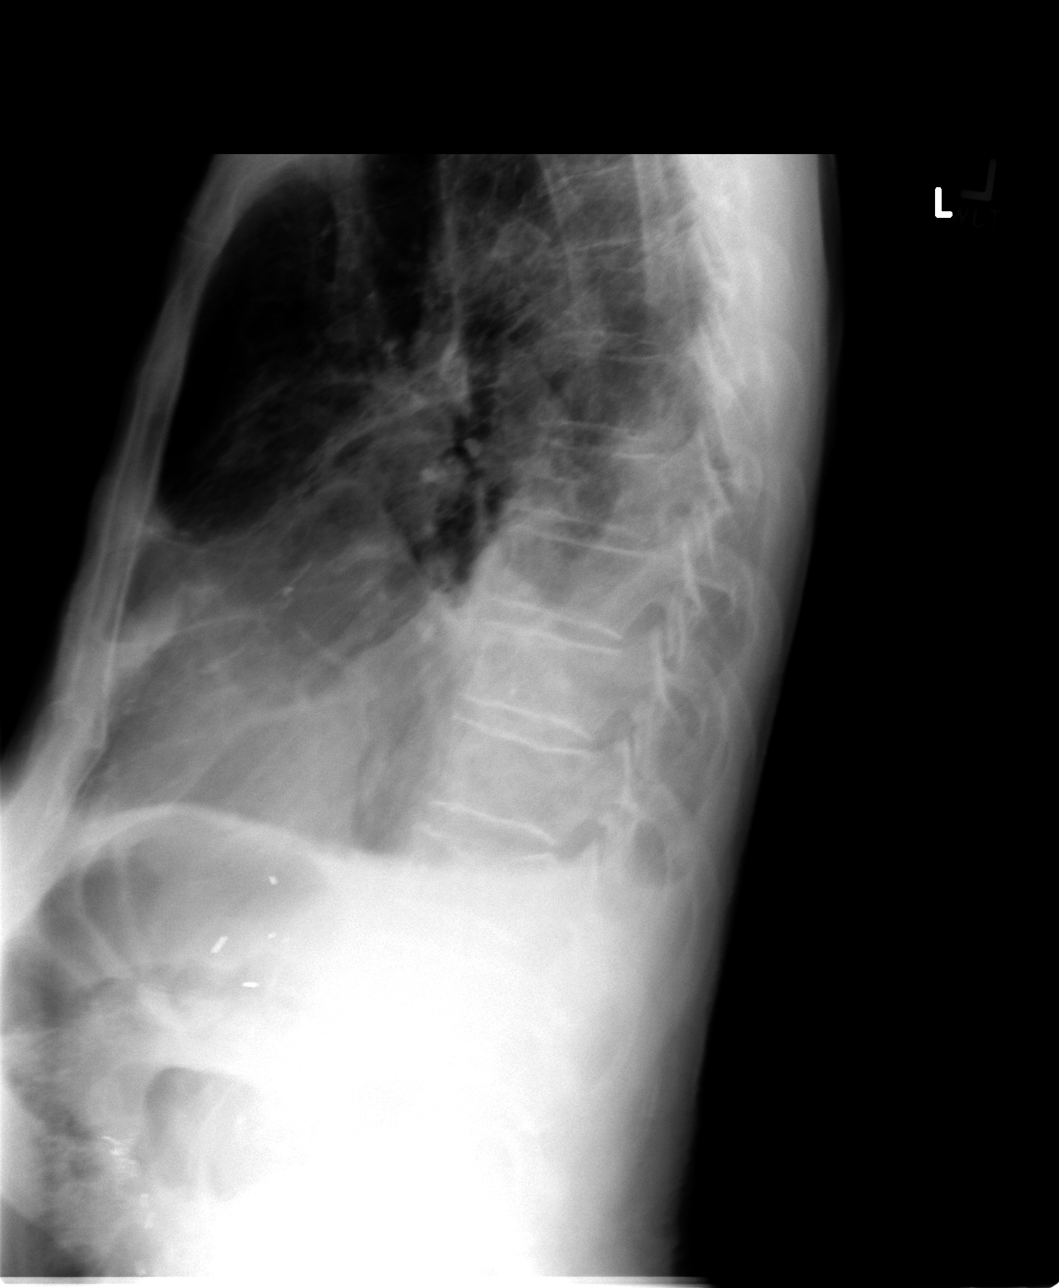

[3 of 3 positions shown; findings below may reference images not displayed]

FINDINGS: There is left base collapse/ consolidation with small left pleural
effusion. Multiple small parenchymal and pleural nodules are seen in
the left hemi thorax. There may be some minimal left-sided volume
loss.

Right lung is clear. Imaged bony structures of the thorax are
intact.
IMPRESSION: Left base collapse/ consolidation with scattered parenchymal and
pleural nodules in the left hemi thorax. The neoplastic process
could have this appearance. CT scan with contrast is recommended to
further evaluate.

These results will be called to the ordering clinician or
representative by the Radiologist Assistant, and communication
documented in the PACS or zVision Dashboard.
